# Patient Record
Sex: Female | Born: 1965
Health system: Southern US, Community
[De-identification: ages and names within clinical notes are randomized; demographics above are authoritative.]

## PROBLEM LIST (undated history)

## (undated) DIAGNOSIS — F419 Anxiety disorder, unspecified: Secondary | ICD-10-CM

## (undated) DIAGNOSIS — G4700 Insomnia, unspecified: Secondary | ICD-10-CM

## (undated) DIAGNOSIS — N951 Menopausal and female climacteric states: Secondary | ICD-10-CM

## (undated) DIAGNOSIS — B351 Tinea unguium: Secondary | ICD-10-CM

## (undated) DIAGNOSIS — M436 Torticollis: Secondary | ICD-10-CM

## (undated) HISTORY — DX: Insomnia, unspecified: G47.00

## (undated) HISTORY — DX: Tinea unguium: B35.1

## (undated) HISTORY — DX: Menopausal and female climacteric states: N95.1

## (undated) HISTORY — DX: Torticollis: M43.6

## (undated) HISTORY — DX: Anxiety disorder, unspecified: F41.9

---

## 2000-02-11 ENCOUNTER — Other Ambulatory Visit: Admission: RE | Admit: 2000-02-11 | Discharge: 2000-02-11 | Payer: Self-pay | Admitting: *Deleted

## 2002-06-11 ENCOUNTER — Other Ambulatory Visit: Admission: RE | Admit: 2002-06-11 | Discharge: 2002-06-11 | Payer: Self-pay | Admitting: Obstetrics and Gynecology

## 2012-11-06 ENCOUNTER — Other Ambulatory Visit: Payer: Self-pay | Admitting: Family Medicine

## 2012-11-06 DIAGNOSIS — Z1231 Encounter for screening mammogram for malignant neoplasm of breast: Secondary | ICD-10-CM

## 2012-11-06 DIAGNOSIS — Z803 Family history of malignant neoplasm of breast: Secondary | ICD-10-CM

## 2012-11-07 ENCOUNTER — Ambulatory Visit: Payer: Self-pay

## 2012-12-05 ENCOUNTER — Ambulatory Visit: Payer: Self-pay

## 2013-12-28 DIAGNOSIS — F419 Anxiety disorder, unspecified: Secondary | ICD-10-CM | POA: Insufficient documentation

## 2013-12-30 DIAGNOSIS — R030 Elevated blood-pressure reading, without diagnosis of hypertension: Secondary | ICD-10-CM

## 2013-12-30 DIAGNOSIS — IMO0001 Reserved for inherently not codable concepts without codable children: Secondary | ICD-10-CM | POA: Insufficient documentation

## 2015-06-01 DIAGNOSIS — M436 Torticollis: Secondary | ICD-10-CM

## 2015-06-01 HISTORY — DX: Torticollis: M43.6

## 2015-07-29 ENCOUNTER — Telehealth: Payer: Self-pay | Admitting: Family Medicine

## 2015-07-29 ENCOUNTER — Ambulatory Visit (INDEPENDENT_AMBULATORY_CARE_PROVIDER_SITE_OTHER): Payer: No Typology Code available for payment source

## 2015-07-29 ENCOUNTER — Encounter: Payer: Self-pay | Admitting: Family Medicine

## 2015-07-29 ENCOUNTER — Ambulatory Visit (INDEPENDENT_AMBULATORY_CARE_PROVIDER_SITE_OTHER): Payer: No Typology Code available for payment source | Admitting: Family Medicine

## 2015-07-29 VITALS — BP 133/78 | HR 74 | Temp 98.4°F | Resp 20 | Ht 65.5 in | Wt 167.0 lb

## 2015-07-29 DIAGNOSIS — G8929 Other chronic pain: Secondary | ICD-10-CM | POA: Insufficient documentation

## 2015-07-29 DIAGNOSIS — M25561 Pain in right knee: Secondary | ICD-10-CM

## 2015-07-29 MED ORDER — TRAMADOL HCL 50 MG PO TABS: 50.0000 mg | ORAL_TABLET | Freq: Three times a day (TID) | ORAL | Status: DC | PRN

## 2015-07-29 MED ORDER — NAPROXEN 500 MG PO TABS: 500.0000 mg | ORAL_TABLET | Freq: Two times a day (BID) | ORAL | Status: DC

## 2015-07-29 NOTE — Patient Instructions (Addendum)
It was a pleasure meeting you today.  - please make an appt at your earliest convenience for annual exam. Your yearly physical, screening and immunizations will be addressed at that time. Your last annual by prior records was 01/2014.   Knee Effusion Knee effusion means that you have excess fluid in your knee joint. This can cause pain and swelling in your knee. This may make your knee more difficult to bend and move. That is because there is increased pain and pressure in the joint. If there is fluid in your knee, it often means that something is wrong inside your knee, such as severe arthritis, abnormal inflammation, or an infection. Another common cause of knee effusion is an injury to the knee muscles, ligaments, or cartilage. HOME CARE INSTRUCTIONS  Use crutches as directed by your health care provider.  Wear a knee brace as directed by your health care provider.  Apply ice to the swollen area:  Put ice in a plastic bag.  Place a towel between your skin and the bag.  Leave the ice on for 20 minutes, 2-3 times per day.  Keep your knee raised (elevated) when you are sitting or lying down.  Take medicines only as directed by your health care provider.  Do any rehabilitation or strengthening exercises as directed by your health care provider.  Rest your knee as directed by your health care provider. You may start doing your normal activities again when your health care provider approves.   Keep all follow-up visits as directed by your health care provider. This is important. SEEK MEDICAL CARE IF:  You have ongoing (persistent) pain in your knee. SEEK IMMEDIATE MEDICAL CARE IF:  You have increased swelling or redness of your knee.  You have severe pain in your knee.  You have a fever.   This information is not intended to replace advice given to you by your health care provider. Make sure you discuss any questions you have with your health care provider.   Document  Released: 08/07/2003 Document Revised: 06/07/2014 Document Reviewed: 12/31/2013 Elsevier Interactive Patient Education [employer]

## 2015-07-29 NOTE — Progress Notes (Signed)
Patient ID: Nancy Berry, female   DOB: September 27, 1965, 50 y.o.   MRN: [id number]      Patient ID: Nancy Berry, female  DOB: 1965-07-28, 50 y.o.   MRN: [id number]  Subjective:  Nancy Berry is a 50 y.o. female present for knee pain and establishment of care.  All past medical history, surgical history, allergies, family history, immunizations, medications and social history were obtained and entered in the electronic medical record today. All recent labs, ED visits and hospitalizations within the last year were reviewed.  Right Knee pain: Right knee pain after re-starting running 2 weeks ago. Sleeping  And kneeling increase her pain. She has used Advil and husbands tramadol. Tramadol helped her sleep, but advil no relief.  She endorses stiffness in the morning and swelling. Swelling has gone down some. No erythema, or fever. Pt denies history of injury or present injury. She denies h/o arthritis.  She has had this prior, after exercise, and it went away without intervention. Patient has iced the area, walking instead of running, wears compression on occassions and feels benefit.   Past Medical History  Diagnosis Date  . Anxiety   . Elevated BP   . Insomnia   . Perimenopause   . Onychomycosis    No Known Allergies Past Surgical History  Procedure Laterality Date  . Cesarean section     No family history on file. Social History   Social History  . Marital Status: Married    Spouse Name: N/A  . Number of Children: N/A  . Years of Education: N/A   Occupational History  . Not on file.   Social History Main Topics  . Smoking status: Not on file  . Smokeless tobacco: Not on file  . Alcohol Use: Not on file  . Drug Use: Not on file  . Sexual Activity: Not on file   Other Topics Concern  . Not on file   Social History Narrative  . No narrative on file    ROS: Negative, with the exception of above mentioned in HPI  Objective: There were no vitals taken for this  visit. Gen: Afebrile. No acute distress. Nontoxic in appearance, well-developed, well-nourished, female HENT: AT. [place]. MMM, no oral lesions. Good dentition Eyes:Pupils Equal Round Reactive to light, Extraocular movements intact,  Conjunctiva without redness, discharge or icterus. Neck/lymp/endocrine: Supple, No lymphadenopathy CV: RRR   Chest: CTAB, no wheeze, rhonchi or crackles.   MSK (right knee): No erythema. effusion and soft tissue swelling present. No TTP joint lines, or bony prominences. Decreased flexion, otherwise full extension. Mild crepitus bilateral knees on exam. No ligament laxity, negative Lachman.  Neurovascularly intact distally.  Skin: No  rashes, purpura or petechiae. Warm and well-perfused. Skin intact. Neuro/Msk: Normal gait. PERLA. EOMi. Alert. Oriented x3.  Psych: Normal affect, dress and demeanor. Normal speech. Normal thought content and judgment.   Assessment/plan: Nancy Berry is a 50 y.o. female present for establishment of care with knee pain.  1. Right knee pain - Compression sleeve use - tramadol moderate to severe for pain only - naproxen (NAPROSYN) 500 MG tablet; Take 1 tablet (500 mg total) by mouth 2 (two) times daily with a meal.  Dispense: 60 tablet; Refill: 0 - DG Knee Complete 4 Views Right; Future - traMADol (ULTRAM) 50 MG tablet; Take 1 tablet (50 mg total) by mouth every 8 (eight) hours as needed.  Dispense: 30 tablet; Refill: 0 - Heat/ice instructions given - F/U in 1 week if not  improving, would consider steroid injection vs sports med referral.  - Pt encouraged to schedule annual exam, last annual 01/2014 by record review.   Electronically signed by: Felix Pacini, DO [facility]

## 2015-07-29 NOTE — Telephone Encounter (Signed)
Please call pt: - her xray result is normal.

## 2015-07-30 NOTE — Telephone Encounter (Signed)
Spoke with patient reviewed xray results. 

## 2015-08-19 ENCOUNTER — Encounter: Payer: Self-pay | Admitting: Gastroenterology

## 2015-08-19 ENCOUNTER — Ambulatory Visit (INDEPENDENT_AMBULATORY_CARE_PROVIDER_SITE_OTHER): Payer: No Typology Code available for payment source | Admitting: Family Medicine

## 2015-08-19 ENCOUNTER — Encounter: Payer: Self-pay | Admitting: Family Medicine

## 2015-08-19 VITALS — BP 111/80 | HR 76 | Temp 97.8°F | Resp 20 | Ht 65.0 in | Wt 164.2 lb

## 2015-08-19 DIAGNOSIS — R635 Abnormal weight gain: Secondary | ICD-10-CM

## 2015-08-19 DIAGNOSIS — Z0000 Encounter for general adult medical examination without abnormal findings: Secondary | ICD-10-CM

## 2015-08-19 DIAGNOSIS — Z13220 Encounter for screening for lipoid disorders: Secondary | ICD-10-CM

## 2015-08-19 DIAGNOSIS — M25561 Pain in right knee: Secondary | ICD-10-CM | POA: Diagnosis not present

## 2015-08-19 DIAGNOSIS — R5383 Other fatigue: Secondary | ICD-10-CM

## 2015-08-19 DIAGNOSIS — Z1211 Encounter for screening for malignant neoplasm of colon: Secondary | ICD-10-CM

## 2015-08-19 DIAGNOSIS — M791 Myalgia, unspecified site: Secondary | ICD-10-CM | POA: Insufficient documentation

## 2015-08-19 DIAGNOSIS — G8929 Other chronic pain: Secondary | ICD-10-CM | POA: Insufficient documentation

## 2015-08-19 DIAGNOSIS — Z114 Encounter for screening for human immunodeficiency virus [HIV]: Secondary | ICD-10-CM

## 2015-08-19 DIAGNOSIS — Z1159 Encounter for screening for other viral diseases: Secondary | ICD-10-CM

## 2015-08-19 LAB — COMPREHENSIVE METABOLIC PANEL
ALBUMIN: 4 g/dL (ref 3.6–5.1)
ALK PHOS: 43 U/L (ref 33–130)
ALT: 24 U/L (ref 6–29)
AST: 20 U/L (ref 10–35)
BILIRUBIN TOTAL: 0.5 mg/dL (ref 0.2–1.2)
BUN: 18 mg/dL (ref 7–25)
CHLORIDE: 107 mmol/L (ref 98–110)
CO2: 20 mmol/L (ref 20–31)
CREATININE: 0.91 mg/dL (ref 0.50–1.05)
Calcium: 8.9 mg/dL (ref 8.6–10.4)
Glucose, Bld: 85 mg/dL (ref 65–99)
Potassium: 4.4 mmol/L (ref 3.5–5.3)
SODIUM: 140 mmol/L (ref 135–146)
TOTAL PROTEIN: 7.1 g/dL (ref 6.1–8.1)

## 2015-08-19 LAB — CBC WITH DIFFERENTIAL/PLATELET
BASOS ABS: 0.1 10*3/uL (ref 0.0–0.1)
BASOS PCT: 1 % (ref 0–1)
Eosinophils Absolute: 0.5 10*3/uL (ref 0.0–0.7)
Eosinophils Relative: 8 % — ABNORMAL HIGH (ref 0–5)
HCT: 45.9 % (ref 36.0–46.0)
HEMOGLOBIN: 15.3 g/dL — AB (ref 12.0–15.0)
LYMPHS ABS: 1.6 10*3/uL (ref 0.7–4.0)
Lymphocytes Relative: 24 % (ref 12–46)
MCH: 31 pg (ref 26.0–34.0)
MCHC: 33.3 g/dL (ref 30.0–36.0)
MCV: 93.1 fL (ref 78.0–100.0)
MONOS PCT: 8 % (ref 3–12)
MPV: 9.8 fL (ref 8.6–12.4)
Monocytes Absolute: 0.5 10*3/uL (ref 0.1–1.0)
NEUTROS ABS: 3.8 10*3/uL (ref 1.7–7.7)
Neutrophils Relative %: 59 % (ref 43–77)
Platelets: 306 10*3/uL (ref 150–400)
RBC: 4.93 MIL/uL (ref 3.87–5.11)
RDW: 13.9 % (ref 11.5–15.5)
WBC: 6.5 10*3/uL (ref 4.0–10.5)

## 2015-08-19 LAB — LIPID PANEL
CHOLESTEROL: 201 mg/dL — AB (ref 125–200)
HDL: 53 mg/dL (ref >=46)
LDL Cholesterol: 134 mg/dL — ABNORMAL HIGH (ref <130)
TRIGLYCERIDES: 72 mg/dL (ref <150)
Total CHOL/HDL Ratio: 3.8 Ratio (ref <=5.0)
VLDL: 14 mg/dL (ref <30)

## 2015-08-19 LAB — VITAMIN B12: VITAMIN B 12: 399 pg/mL (ref 200–1100)

## 2015-08-19 LAB — HIV ANTIBODY (ROUTINE TESTING W REFLEX): HIV: NONREACTIVE

## 2015-08-19 LAB — TSH: TSH: 1.35 mIU/L

## 2015-08-19 MED ORDER — TRAMADOL HCL 50 MG PO TABS: 50.0000 mg | ORAL_TABLET | Freq: Three times a day (TID) | ORAL | Status: DC | PRN

## 2015-08-19 NOTE — Progress Notes (Signed)
Patient ID: Nancy Berry, female   DOB: [birth year], 50 y.o.   MRN: [id number]      Patient ID: Nancy Berry, female  DOB: 04/18/1966, 50 y.o.   MRN: [id number]  Subjective:  Nancy Berry is a 50 y.o. female present for annual visit.  All past medical history, surgical history, allergies, family history, immunizations, medications and social history were updated in the electronic medical record today. All recent labs, ED visits and hospitalizations within the last year were reviewed.  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 But she feels she can use to gain weight of a total of 15 pounds. She feels this is adding stress to her knees, and this is the cause of her knee discomfort. She endorses increased fatigue, achy joints and muscles.   Right knee pain: Patient's right knee pain is much improved. She states it still mildly swollen. All x-rays were normal. Patient feels it is her weight is causing her to put more stress on her knees. She is asking for refill on tramadol, and the event it swells again. She feels that when she does attempt to start exercising again, and again flares. She does not desire a steroid injection at this time.  Health maintenance:  Colonoscopy: MGM colon cancer >60, No screening completed yet. Mammogram: UTD last mammogram 2015 ([facility]) prior normal, FHX breast cancer in mother, patient has mammogram scheduled Cervical cancer screening: UTD last PAP 2015, unknown co-testing. Patient has gynecology appointment scheduled. Immunizations: tdap UTD, declined flu Infectious disease screening: HIV and Hep C indicated DEXA: screen 65 Assistive device: None  Oxygen use: None Patient has a Dental home. Hospitalizations/ED visits: None   Depression screen [facility] 2/9 08/19/2015  Decreased Interest 0  Down, Depressed, Hopeless 0  PHQ - 2 Score 0   Fall Risk  08/19/2015  Falls in the past year? No   Current Exercise Habits:  Structured exercise class, Type of exercise: calisthenics;treadmill, Time (Minutes): 30, Frequency (Times/Week): 3, Weekly Exercise (Minutes/Week): 90, Intensity: Moderate    Past Medical History  Diagnosis Date  . Anxiety   . Elevated BP   . Insomnia   . Perimenopause   . Onychomycosis    No Known Allergies Past Surgical History  Procedure Laterality Date  . Cesarean section     Family History  Problem Relation Age of Onset  . Breast cancer Mother 50  . Colon cancer Maternal Grandmother 75   Social History   Social History  . Marital Status: Married    Spouse Name: N/A  . Number of Children: N/A  . Years of Education: N/A   Occupational History  . Not on file.   Social History Main Topics  . Smoking status: Never Smoker   . Smokeless tobacco: Never Used  . Alcohol Use: No  . Drug Use: No  . Sexual Activity: Yes     Comment: "withdrawal"   Other Topics Concern  . Not on file   Social History Narrative   Married, husband Ron. 2 children [place], [place])    AAS degree, Loss analyst    Drinks caffeine, uses herbal remedies.    Wears seatbelt, exercises >3x a week, smoke detector in the home   Firearms  In the home (locked cabinets)   Feels safe in her relationships.         ROS: Negative, with the exception of above mentioned in HPI  Objective: BP 111/80 mmHg  Pulse 76  Temp(Src) 97.8 F (36.6 C)  Resp 20  Ht '5\' 5"'$  (1.651 m)  Wt 164 lb 4 oz (74.503 kg)  BMI 27.33 kg/m2  SpO2 96%  LMP 07/02/2015 Gen: Afebrile. No acute distress. Nontoxic in appearance, well-developed, well-nourished, female, Pleasant Caucasian female   HENT: AT. [place]. Bilateral TM visualized and normal in appearance, normal external auditory canal. MMM, no oral lesions, good dentition. Bilateral nares without erythema or swelling. Throat without erythema, ulcerations or exudates. No Cough on exam, no hoarseness on exam. Eyes:Pupils Equal Round Reactive to light, Extraocular movements  intact,  Conjunctiva without redness, discharge or icterus. Neck/lymp/endocrine: Supple, no lymphadenopathy, no thyromegaly CV: RRR no murmur appreciated, no edema, +2/4 P posterior tibialis pulses. No carotid bruits. No JVD. Chest: CTAB, no wheeze, rhonchi or crackles. Normal Respiratory effort. Good Air movement. Abd: Soft. Flat. NTND. BS present. No Masses palpated. No hepatosplenomegaly. No rebound tenderness or guarding. Skin: No rashes, purpura or petechiae. Warm and well-perfused. Skin intact. Neuro/Msk:  Normal gait. PERLA. EOMi. Alert. Oriented x3.  Cranial nerves II through XII intact. Consistent small knee effusion, right. Muscle strength 5/5 upper and lower extremity. DTRs equal bilaterally. Psych: Normal affect, dress and demeanor. Normal speech. Normal thought content and judgment.   Assessment/plan: Nancy Berry is a 50 y.o. female present for annual exam.  1. Right knee pain - right knee still with mild effusion/swelling today. Pt states the pain is much better. Requested additional tramadol for acute pain only today. I am agreeable to refill as long as not requiring more than 3 scripts a year. - traMADol (ULTRAM) 50 MG tablet; Take 1 tablet (50 mg total) by mouth every 8 (eight) hours as needed.  Dispense: 30 tablet; Refill: 0  2. Other fatigue/muscle aches/weight gain - Possible secondary to weight gain. Will check labs today and consider further workup dependent on results.  - consider ESR/CRP etc if labs today abnormal - TSH - VITAMIN D 25 Hydroxy (Vit-D Deficiency, Fractures) - B12 - CBC w/Diff - Comp Met (CMET)  3. Need for hepatitis C screening test - Hepatitis C Antibody  4. Encounter for screening for HIV - HIV antibody (with reflex)  5. Weight gain - TSH - encouraged routine exercise, dietary modifications.   6. Screening cholesterol level - Lipid panel  7. Colon cancer screening - Ambulatory referral to Gastroenterology  8. Preventive healthcare  encounter: Gender specific AVS was provided to patient today. Patient was encouraged to supplement with over-the-counter 600-800 units of vitamin D daily. Patient was encouraged to exercise greater than 150 minutes a week.   Return in about 1 year (around 08/18/2016) for CPE.  Electronically signed by: Howard Pouch, DO [place]

## 2015-08-19 NOTE — Patient Instructions (Signed)
Health Maintenance, Female Adopting a healthy lifestyle and getting preventive care can go a long way to promote health and wellness. Talk with your health care provider about what schedule of regular examinations is right for you. This is a good chance for you to check in with your provider about disease prevention and staying healthy. In between checkups, there are plenty of things you can do on your own. Experts have done a lot of research about which lifestyle changes and preventive measures are most likely to keep you healthy. Ask your health care provider for more information. WEIGHT AND DIET  Eat a healthy diet  Be sure to include plenty of vegetables, fruits, low-fat dairy products, and lean protein.  Do not eat a lot of foods high in solid fats, added sugars, or salt.  Get regular exercise. This is one of the most important things you can do for your health.  Most adults should exercise for at least 150 minutes each week. The exercise should increase your heart rate and make you sweat (moderate-intensity exercise).  Most adults should also do strengthening exercises at least twice a week. This is in addition to the moderate-intensity exercise.  Maintain a healthy weight  Body mass index (BMI) is a measurement that can be used to identify possible weight problems. It estimates body fat based on height and weight. Your health care provider can help determine your BMI and help you achieve or maintain a healthy weight.  For females 20 years of age and older:   A BMI below 18.5 is considered underweight.  A BMI of 18.5 to 24.9 is normal.  A BMI of 25 to 29.9 is considered overweight.  A BMI of 30 and above is considered obese.  Watch levels of cholesterol and blood lipids  You should start having your blood tested for lipids and cholesterol at 52 years of age, then have this test every 5 years.  You may need to have your cholesterol levels checked more often if:  Your lipid  or cholesterol levels are high.  You are older than 52 years of age.  You are at high risk for heart disease.  CANCER SCREENING   Lung Cancer  Lung cancer screening is recommended for adults 55-80 years old who are at high risk for lung cancer because of a history of smoking.  A yearly low-dose CT scan of the lungs is recommended for people who:  Currently smoke.  Have quit within the past 15 years.  Have at least a 30-pack-year history of smoking. A pack year is smoking an average of one pack of cigarettes a day for 1 year.  Yearly screening should continue until it has been 15 years since you quit.  Yearly screening should stop if you develop a health problem that would prevent you from having lung cancer treatment.  Breast Cancer  Practice breast self-awareness. This means understanding how your breasts normally appear and feel.  It also means doing regular breast self-exams. Let your health care provider know about any changes, no matter how small.  If you are in your 20s or 30s, you should have a clinical breast exam (CBE) by a health care provider every 1-3 years as part of a regular health exam.  If you are 40 or older, have a CBE every year. Also consider having a breast X-ray (mammogram) every year.  If you have a family history of breast cancer, talk to your health care provider about genetic screening.  If you   are at high risk for breast cancer, talk to your health care provider about having an MRI and a mammogram every year.  Breast cancer gene (BRCA) assessment is recommended for women who have family members with BRCA-related cancers. BRCA-related cancers include:  Breast.  Ovarian.  Tubal.  Peritoneal cancers.  Results of the assessment will determine the need for genetic counseling and BRCA1 and BRCA2 testing. Cervical Cancer Your health care provider may recommend that you be screened regularly for cancer of the pelvic organs (ovaries, uterus, and  vagina). This screening involves a pelvic examination, including checking for microscopic changes to the surface of your cervix (Pap test). You may be encouraged to have this screening done every 3 years, beginning at age 21.  For women ages 30-65, health care providers may recommend pelvic exams and Pap testing every 3 years, or they may recommend the Pap and pelvic exam, combined with testing for human papilloma virus (HPV), every 5 years. Some types of HPV increase your risk of cervical cancer. Testing for HPV may also be done on women of any age with unclear Pap test results.  Other health care providers may not recommend any screening for nonpregnant women who are considered low risk for pelvic cancer and who do not have symptoms. Ask your health care provider if a screening pelvic exam is right for you.  If you have had past treatment for cervical cancer or a condition that could lead to cancer, you need Pap tests and screening for cancer for at least 20 years after your treatment. If Pap tests have been discontinued, your risk factors (such as having a new sexual partner) need to be reassessed to determine if screening should resume. Some women have medical problems that increase the chance of getting cervical cancer. In these cases, your health care provider may recommend more frequent screening and Pap tests. Colorectal Cancer  This type of cancer can be detected and often prevented.  Routine colorectal cancer screening usually begins at 52 years of age and continues through 52 years of age.  Your health care provider may recommend screening at an earlier age if you have risk factors for colon cancer.  Your health care provider may also recommend using home test kits to check for hidden blood in the stool.  A small camera at the end of a tube can be used to examine your colon directly (sigmoidoscopy or colonoscopy). This is done to check for the earliest forms of colorectal  cancer.  Routine screening usually begins at age 50.  Direct examination of the colon should be repeated every 5-10 years through 52 years of age. However, you may need to be screened more often if early forms of precancerous polyps or small growths are found. Skin Cancer  Check your skin from head to toe regularly.  Tell your health care provider about any new moles or changes in moles, especially if there is a change in a mole's shape or color.  Also tell your health care provider if you have a mole that is larger than the size of a pencil eraser.  Always use sunscreen. Apply sunscreen liberally and repeatedly throughout the day.  Protect yourself by wearing long sleeves, pants, a wide-brimmed hat, and sunglasses whenever you are outside. HEART DISEASE, DIABETES, AND HIGH BLOOD PRESSURE   High blood pressure causes heart disease and increases the risk of stroke. High blood pressure is more likely to develop in:  People who have blood pressure in the high end   of the normal range (130-139/85-89 mm Hg).  People who are overweight or obese.  People who are African American.  If you are 38-23 years of age, have your blood pressure checked every 3-5 years. If you are 61 years of age or older, have your blood pressure checked every year. You should have your blood pressure measured twice--once when you are at a hospital or clinic, and once when you are not at a hospital or clinic. Record the average of the two measurements. To check your blood pressure when you are not at a hospital or clinic, you can use:  An automated blood pressure machine at a pharmacy.  A home blood pressure monitor.  If you are between 45 years and 39 years old, ask your health care provider if you should take aspirin to prevent strokes.  Have regular diabetes screenings. This involves taking a blood sample to check your fasting blood sugar level.  If you are at a normal weight and have a low risk for diabetes,  have this test once every three years after 52 years of age.  If you are overweight and have a high risk for diabetes, consider being tested at a younger age or more often. PREVENTING INFECTION  Hepatitis B  If you have a higher risk for hepatitis B, you should be screened for this virus. You are considered at high risk for hepatitis B if:  You were born in a country where hepatitis B is common. Ask your health care provider which countries are considered high risk.  Your parents were born in a high-risk country, and you have not been immunized against hepatitis B (hepatitis B vaccine).  You have HIV or AIDS.  You use needles to inject street drugs.  You live with someone who has hepatitis B.  You have had sex with someone who has hepatitis B.  You get hemodialysis treatment.  You take certain medicines for conditions, including cancer, organ transplantation, and autoimmune conditions. Hepatitis C  Blood testing is recommended for:  Everyone born from [birth year] through 1967.  Anyone with known risk factors for hepatitis C. Sexually transmitted infections (STIs)  You should be screened for sexually transmitted infections (STIs) including gonorrhea and chlamydia if:  You are sexually active and are younger than 52 years of age.  You are older than 53 years of age and your health care provider tells you that you are at risk for this type of infection.  Your sexual activity has changed since you were last screened and you are at an increased risk for chlamydia or gonorrhea. Ask your health care provider if you are at risk.  If you do not have HIV, but are at risk, it may be recommended that you take a prescription medicine daily to prevent HIV infection. This is called pre-exposure prophylaxis (PrEP). You are considered at risk if:  You are sexually active and do not regularly use condoms or know the HIV status of your partner(s).  You take drugs by injection.  You are sexually  active with a partner who has HIV. Talk with your health care provider about whether you are at high risk of being infected with HIV. If you choose to begin PrEP, you should first be tested for HIV. You should then be tested every 3 months for as long as you are taking PrEP.  PREGNANCY   If you are premenopausal and you may become pregnant, ask your health care provider about preconception counseling.  If you may  become pregnant, take 400 to 800 micrograms (mcg) of folic acid every day.  If you want to prevent pregnancy, talk to your health care provider about birth control (contraception). OSTEOPOROSIS AND MENOPAUSE   Osteoporosis is a disease in which the bones lose minerals and strength with aging. This can result in serious bone fractures. Your risk for osteoporosis can be identified using a bone density scan.  If you are 61 years of age or older, or if you are at risk for osteoporosis and fractures, ask your health care provider if you should be screened.  Ask your health care provider whether you should take a calcium or vitamin D supplement to lower your risk for osteoporosis.  Menopause may have certain physical symptoms and risks.  Hormone replacement therapy may reduce some of these symptoms and risks. Talk to your health care provider about whether hormone replacement therapy is right for you.  HOME CARE INSTRUCTIONS   Schedule regular health, dental, and eye exams.  Stay current with your immunizations.   Do not use any tobacco products including cigarettes, chewing tobacco, or electronic cigarettes.  If you are pregnant, do not drink alcohol.  If you are breastfeeding, limit how much and how often you drink alcohol.  Limit alcohol intake to no more than 1 drink per day for nonpregnant women. One drink equals 12 ounces of beer, 5 ounces of wine, or 1 ounces of hard liquor.  Do not use street drugs.  Do not share needles.  Ask your health care provider for help if  you need support or information about quitting drugs.  Tell your health care provider if you often feel depressed.  Tell your health care provider if you have ever been abused or do not feel safe at home.   This information is not intended to replace advice given to you by your health care provider. Make sure you discuss any questions you have with your health care provider.   Document Released: 11/30/2010 Document Revised: 06/07/2014 Document Reviewed: 04/18/2013 Elsevier Interactive Patient Education [employer].

## 2015-08-20 ENCOUNTER — Telehealth: Payer: Self-pay | Admitting: Family Medicine

## 2015-08-20 LAB — HEPATITIS C ANTIBODY: HCV AB: NEGATIVE

## 2015-08-20 LAB — VITAMIN D 25 HYDROXY (VIT D DEFICIENCY, FRACTURES): VIT D 25 HYDROXY: 27 ng/mL — AB (ref 30–100)

## 2015-08-20 MED ORDER — VITAMIN D (ERGOCALCIFEROL) 1.25 MG (50000 UNIT) PO CAPS: 50000.0000 [IU] | ORAL_CAPSULE | ORAL | Status: DC

## 2015-08-20 NOTE — Telephone Encounter (Signed)
Please call pt: - Her Vit D is mildly insufficient with a level of 27, normal is 30, however prefer D to be in the 40s. I have called in a 12 week once weekly Vit D script. She is to start  800 u of vitamin D OTC, after 12 week supplement complete. - B12 is normal, but lower end normal, could benefit from daily b12 (usually 100mg ). - Thyroid, and all other labs are normal - HIV and Hep C negative - would want to followup with her in 12 weeks for an appt to follow up vit d, and present symptoms, if no resolution we can proceed with further work up if she desires, sooner if worsening symptoms.

## 2015-08-20 NOTE — Telephone Encounter (Signed)
Spoke with patient reviewed lab results and instructions. Patient verbalized understanding . Vit D Rx sent to pharmacy.

## 2015-08-27 ENCOUNTER — Telehealth: Payer: Self-pay | Admitting: Family Medicine

## 2015-08-27 DIAGNOSIS — F419 Anxiety disorder, unspecified: Secondary | ICD-10-CM

## 2015-08-27 MED ORDER — LORAZEPAM 0.5 MG PO TABS: 0.5000 mg | ORAL_TABLET | Freq: Two times a day (BID) | ORAL | Status: DC | PRN

## 2015-08-27 NOTE — Telephone Encounter (Signed)
Patient is requesting medication for her nerves. She said she is taking Tramodol which helps but feels like it doesn't calm her nerves completely. Please contact patient

## 2015-08-27 NOTE — Telephone Encounter (Signed)
Please call pt: - I received her request after 5 pm for "something for her nerves". Patient has not been evaluated for anxiety, by this provider. She does have a past medical history of anxiety with ativan 0.5 mg TID PRN use. I am willing to prescribe 1 month with no refills of the ativan at prior dose, but she will need to follow up to discuss prior to refill to make sure her anxiety is controlled and have documentation of problem.

## 2015-08-28 ENCOUNTER — Encounter: Payer: Self-pay | Admitting: Family Medicine

## 2015-08-28 DIAGNOSIS — R928 Other abnormal and inconclusive findings on diagnostic imaging of breast: Secondary | ICD-10-CM | POA: Insufficient documentation

## 2015-08-28 NOTE — Telephone Encounter (Signed)
Rx called to pharmacy

## 2015-10-09 ENCOUNTER — Encounter: Payer: No Typology Code available for payment source | Admitting: Gastroenterology

## 2015-12-05 ENCOUNTER — Ambulatory Visit (INDEPENDENT_AMBULATORY_CARE_PROVIDER_SITE_OTHER): Payer: No Typology Code available for payment source | Admitting: Family Medicine

## 2015-12-05 ENCOUNTER — Encounter: Payer: Self-pay | Admitting: Family Medicine

## 2015-12-05 ENCOUNTER — Telehealth: Payer: Self-pay | Admitting: Family Medicine

## 2015-12-05 VITALS — BP 128/78 | HR 76 | Temp 98.0°F | Resp 20 | Ht 65.0 in | Wt 164.8 lb

## 2015-12-05 DIAGNOSIS — F419 Anxiety disorder, unspecified: Secondary | ICD-10-CM | POA: Diagnosis not present

## 2015-12-05 DIAGNOSIS — E559 Vitamin D deficiency, unspecified: Secondary | ICD-10-CM

## 2015-12-05 LAB — VITAMIN D 25 HYDROXY (VIT D DEFICIENCY, FRACTURES): VITD: 44.77 ng/mL (ref 30.00–100.00)

## 2015-12-05 MED ORDER — LORAZEPAM 0.5 MG PO TABS: 0.5000 mg | ORAL_TABLET | Freq: Two times a day (BID) | ORAL | Status: DC | PRN

## 2015-12-05 NOTE — Telephone Encounter (Signed)
Please call pt: - her labs are normal.  - Please advise her to take 800 u day, 1000 u of calcium daily  Results for orders placed or performed in visit on 12/05/15 (from the past 24 hour(s))  VITAMIN D 25 Hydroxy (Vit-D Deficiency, Fractures)     Status: None   Collection Time: 12/05/15  2:00 PM  Result Value Ref Range   VITD 44.77 30.00 - 100.00 ng/mL

## 2015-12-05 NOTE — Progress Notes (Signed)
Patient ID: Nancy BealSherri L Godinho, female   DOB: 03-25-1966, 50 y.o.   MRN: [id number]      Patient ID: Nancy BealSherri L Schamp, female  DOB: 03-25-1966, 50 y.o.   MRN: [id number]  Subjective:  Nancy Berry is a 50 y.o. female present for follow up on fatigue and vit d insuffiencey. She has been taking the the Vitamin D 7829550000 u weekly.   Anxiety situational: patient uses ativan 0.5 mg as needed. She is currently having problems with her mother, who is now in the end stages of dementia. [facility] and is not expected to live much longer.   fatigue: Patient states she does feel mild improvement in her fatigue with the increase in vit d. She does not sleep well secondary to staying with her mother, who is in a dementia facility.   Right knee pain: Patient's right knee pain improved, but still has occasional flares in which she takes tramadol. She reports the knee worsens with bending, squatting, walking long distances. She reports it occurs infrequently and does not feel she needs referral at this time.    Depression screen PHQ 2/9 08/19/2015  Decreased Interest 0  Down, Depressed, Hopeless 0  PHQ - 2 Score 0   Fall Risk  08/19/2015  Falls in the past year? No        Past Medical History  Diagnosis Date  . Anxiety   . Elevated BP   . Insomnia   . Perimenopause   . Onychomycosis    No Known Allergies Past Surgical History  Procedure Laterality Date  . Cesarean section     Family History  Problem Relation Age of Onset  . Breast cancer Mother 8975  . Colon cancer Maternal Grandmother 4075   Social History   Social History  . Marital Status: Married    Spouse Name: N/A  . Number of Children: N/A  . Years of Education: N/A   Occupational History  . Not on file.   Social History Main Topics  . Smoking status: Never Smoker   . Smokeless tobacco: Never Used  . Alcohol Use: No  . Drug Use: No  . Sexual Activity: Yes     Comment: "withdrawal"   Other Topics Concern    . Not on file   Social History Narrative   Married, husband Ron. 2 children Rosalyn Gess([place], [place])    AAS degree, Loss analyst    Drinks caffeine, uses herbal remedies.    Wears seatbelt, exercises >3x a week, smoke detector in the home   Firearms  In the home (locked cabinets)   Feels safe in her relationships.         ROS: Negative, with the exception of above mentioned in HPI  Objective: BP 128/78 mmHg  Pulse 76  Temp(Src) 98 F (36.7 C)  Resp 20  Ht 5\' 5"  (1.651 m)  Wt 164 lb 12 oz (74.73 kg)  BMI 27.42 kg/m2  SpO2 96% Gen: Afebrile. No acute distress. Nontoxic in appearance, well-developed, well-nourished, female, Pleasant Caucasian female   HENT: AT. [place]. . MMM, no oral lesions, Eyes:Pupils Equal Round Reactive to light, Extraocular movements intact,  Conjunctiva without redness, discharge or icterus. CV: RRR no murmur appreciated, no edema, +2/4 P posterior tibialis pulses.  Chest: CTAB, no wheeze, rhonchi or crackles.  Abd: Soft. Flat. NTND. BS present. Skin: Warm and well-perfused. Skin intact. Neuro/Msk:  Normal gait. PERLA. EOMi. Alert. Oriented x3.   Psych: Normal affect, dress and  demeanor. Normal speech. Normal thought content and judgment.   Assessment/plan: Nancy BealSherri L Widjaja is a 50 y.o. female present for follow visit on : 1. Right knee pain - right knee still with mild effusion/swelling today. Pt states the pain is much better. Requested additional tramadol for acute pain only today. I am agreeable to refill as long as not requiring more than 3 scripts a year. - traMADol (ULTRAM) 50 MG tablet; Take 1 tablet (50 mg total) by mouth every 8 (eight) hours as needed.  Dispense: 30 tablet; Refill: 0  2. Other fatigue/muscle aches/weight gain/vit d insufficiency: - improved. TSH, B12, cbc and CMP normal.  - Vit d insufficient and at end of 12 week supplement.  - recheck today and advise on ca/vit d supplement dose.   3. Anxiety: refills on ativan today.   > 25  minutes spent with patient, >50% of time spent face to face counseling patient and coordinating care.  Electronically signed by: Felix Pacinienee Vanesha [place], DO [facility] [place]

## 2015-12-05 NOTE — Patient Instructions (Signed)
We will fax in ativan for you.  We will call you with Vit D level and advise of dose.

## 2015-12-08 NOTE — Telephone Encounter (Signed)
Spoke with patient reviewed lab results and instructions. Patient verbalized understanding. 

## 2015-12-19 ENCOUNTER — Telehealth: Payer: Self-pay | Admitting: *Deleted

## 2015-12-19 DIAGNOSIS — M25561 Pain in right knee: Secondary | ICD-10-CM

## 2015-12-19 MED ORDER — TRAMADOL HCL 50 MG PO TABS: 50.0000 mg | ORAL_TABLET | Freq: Three times a day (TID) | ORAL | Status: DC | PRN

## 2015-12-19 NOTE — Telephone Encounter (Signed)
Pt LMOM on 12/19/15 at 8:11am requesting refill for Tramadol. She stated that at her last office visit she was told by Dr. Claiborne BillingsKuneff that if her knee started bothering her again she would give her another Rx for Tramadol.   RF request for Tramadol LOV: 12/05/15 Next ov: None Last written: 08/19/15 #30 w/ 0RF  Spoke verbally with Dr. Milinda CaveMcGowen and he approved refill. Rx printed and signed by Dr. Milinda CaveMcGowen.  Spoke to pt to find out where she would like Rx sent. She [place] but they may be coming back home. She stated that she will call back later to let us know where to send Rx.

## 2015-12-19 NOTE — Telephone Encounter (Signed)
Pt LMOM up front requesting that Rx be sent to [facility]. Rx called into pharmacy.

## 2016-04-12 ENCOUNTER — Telehealth: Payer: Self-pay | Admitting: *Deleted

## 2016-04-12 DIAGNOSIS — G8929 Other chronic pain: Secondary | ICD-10-CM

## 2016-04-12 DIAGNOSIS — M25561 Pain in right knee: Principal | ICD-10-CM

## 2016-04-12 NOTE — Telephone Encounter (Signed)
Patient called requesting refill on Tramadol for her knee pain. Last seen for this 07/29/15. Last refill 12/19/15 for 30 tabs. Patient states ibuprofen and tylenol do not work for her. Please advise.

## 2016-04-13 MED ORDER — TRAMADOL HCL 50 MG PO TABS: 50.0000 mg | ORAL_TABLET | Freq: Three times a day (TID) | ORAL | 0 refills | Status: DC | PRN

## 2016-04-13 NOTE — Telephone Encounter (Signed)
Received request for tramadol refill for intermittent chronic knee pain. Refill prescribed.

## 2016-04-27 ENCOUNTER — Ambulatory Visit (INDEPENDENT_AMBULATORY_CARE_PROVIDER_SITE_OTHER): Payer: No Typology Code available for payment source | Admitting: Family Medicine

## 2016-04-27 ENCOUNTER — Encounter: Payer: Self-pay | Admitting: Family Medicine

## 2016-04-27 VITALS — BP 109/74 | HR 73 | Temp 98.0°F | Resp 20 | Ht 65.0 in | Wt 169.5 lb

## 2016-04-27 DIAGNOSIS — M436 Torticollis: Secondary | ICD-10-CM | POA: Diagnosis not present

## 2016-04-27 DIAGNOSIS — M542 Cervicalgia: Secondary | ICD-10-CM | POA: Diagnosis not present

## 2016-04-27 MED ORDER — NAPROXEN 500 MG PO TABS: 500.0000 mg | ORAL_TABLET | Freq: Two times a day (BID) | ORAL | 0 refills | Status: DC

## 2016-04-27 MED ORDER — BACLOFEN 20 MG PO TABS: 20.0000 mg | ORAL_TABLET | Freq: Three times a day (TID) | ORAL | 0 refills | Status: DC

## 2016-04-27 MED ORDER — KETOROLAC TROMETHAMINE 60 MG/2ML IM SOLN
60.0000 mg | Freq: Once | INTRAMUSCULAR | Status: AC
  Administered 2016-04-27: 60 mg via INTRAMUSCULAR

## 2016-04-27 NOTE — Progress Notes (Signed)
Nancy BealSherri L Bise , 1966/04/09, 50 y.o., female MRN: [id number] Patient Care Team    Relationship Specialty Notifications Start End  Natalia Leatherwoodenee A Clent Damore, DO PCP - General Family Medicine  07/29/15   Lynden AngElizabeth Lane, NP Nurse Practitioner Obstetrics and Gynecology  08/19/15     CC: neck pain  Subjective: Pt presents for an acute OV with complaints of neck pain of 1 day duration.   Pt feels symptoms are progressively getting worse and it feels like a muscle cramp on the left side of her neck. She at first thought it was stress related or she slept on it wrong. She has never had this before. She is on the computer a great deal, but her position has unchanged. She denies trauma or injury.  Pt has tried tramadol to ease their symptoms. She states this has always taken away other pains, and it has not touched her neck pain. She rates her pain about 8/10 with movement and about 6/10 when she is still. She denies fever, chills, sore throat. She does not feel like she is able to move her neck. She is able to swallow, denies visual changes or tinnitus.   No Known Allergies Social History  Substance Use Topics  . Smoking status: Never Smoker  . Smokeless tobacco: Never Used  . Alcohol use No   Past Medical History:  Diagnosis Date  . Anxiety   . Elevated BP   . Insomnia   . Onychomycosis   . Perimenopause    Past Surgical History:  Procedure Laterality Date  . CESAREAN SECTION     Family History  Problem Relation Age of Onset  . Breast cancer Mother 6775  . Colon cancer Maternal Grandmother [id number]     Medication List       Accurate as of 04/27/16  8:55 AM. Always use your most recent med list.          LORazepam 0.5 MG tablet Commonly known as:  ATIVAN Take 1 tablet (0.5 mg total) by mouth 2 (two) times daily as needed for anxiety.   PROBIOTIC DAILY PO Take by mouth.   traMADol 50 MG tablet Commonly known as:  ULTRAM Take 1 tablet (50 mg total) by mouth every 8 (eight) hours as  needed.       No results found for this or any previous visit (from the past 24 hour(s)). No results found.   ROS: Negative, with the exception of above mentioned in HPI   Objective:  BP 109/74 (BP Location: Left Arm, Patient Position: Sitting, Cuff Size: Large)   Pulse 73   Temp 98 F (36.7 C)   Resp 20   Ht 5\' 5"  (1.651 m)   Wt 169 lb 8 oz (76.9 kg)   SpO2 98%   BMI 28.21 kg/m  Body mass index is 28.21 kg/m. Gen: Afebrile. No acute distress. Nontoxic in appearance, well developed, well nourished.  HENT: AT. [place]. MMM, no oral lesions.  Eyes:Pupils Equal Round Reactive to light, Extraocular movements intact,  Conjunctiva without redness, discharge or icterus. Neck/lymp/endocrine: Supple,no lymphadenopathy MSK: no erythema or bruising. Severe muscle spasm left upper and mid trap. Ropiness present. No bone tenderness. Decreased ROM fall planes. NV intact distally.  Skin: No  rashes, purpura or petechiae.  Neuro: Normal gait. PERLA. EOMi. Alert. Oriented x3 Cranial nerves II through XII intact.   Assessment/Plan: Nancy Berry is a 50 y.o. female present for acute OV for  Neck pain/Acute torticollis - heat therapy discussed,  light stretches.  - baclofen and naproxen schedule for 1 week.  - behavior modifications to not tilt head to left. Move monitors, TV, pillows  etc.  - baclofen (LIORESAL) 20 MG tablet; Take 1 tablet (20 mg total) by mouth 3 (three) times daily.  Dispense: 90 each; Refill: 0 - naproxen (NAPROSYN) 500 MG tablet; Take 1 tablet (500 mg total) by mouth 2 (two) times daily with a meal.  Dispense: 30 tablet; Refill: 0 - referral to Dr. Terrilee FilesZach Smith, DO for OMT.  - F/U 1-2 weeks or sooner if worsening or not already seeing Dr. Katrinka BlazingSmith    electronically signed by:  Felix Pacinienee Niasia Lanphear, DO  [facility] [place]

## 2016-04-27 NOTE — Patient Instructions (Addendum)
Acute Torticollis Torticollis is a condition in which the muscles of the neck tighten (contract) abnormally, causing the neck to twist and the head to move into an unnatural position. Torticollis that develops suddenly is called acute torticollis. If torticollis becomes chronic and is left untreated, the face and neck can become deformed. CAUSES This condition may be caused by:  Sleeping in an awkward position (common).  Extending or twisting the neck muscles beyond their normal position.  Infection. In some cases, the cause may not be known. SYMPTOMS Symptoms of this condition include:  An unnatural position of the head.  Neck pain.  A limited ability to move the neck.  Twisting of the neck to one side. DIAGNOSIS This condition is diagnosed with a physical exam. You may also have imaging tests, such as an X-ray, CT scan, or MRI. TREATMENT Treatment for this condition involves trying to relax the neck muscles. It may include:  Medicines or shots.  Physical therapy.  Surgery. This may be done in severe cases. HOME CARE INSTRUCTIONS  Take medicines only as directed by your health care provider.  Do stretching exercises and massage your neck as directed by your health care provider.  Keep all follow-up visits as directed by your health care provider. This is important. SEEK MEDICAL CARE IF:  You develop a fever. SEEK IMMEDIATE MEDICAL CARE IF:  You develop difficulty breathing.  You develop noisy breathing (stridor).  You start drooling.  You have trouble swallowing or have pain with swallowing.  You develop numbness or weakness in your hands or feet.  You have changes in your speech, understanding, or vision.  Your pain gets worse. This information is not intended to replace advice given to you by your health care provider. Make sure you discuss any questions you have with your health care provider. Document Released: 05/14/2000 Document Revised: 09/08/2015  Document Reviewed: 05/13/2014 Elsevier Interactive Patient Education  2017 [employer]  Start baclofen every 8 hours for 1 week.  Naproxen every 12 hours with food 1 week, then prn for pain. Start tomorrow.  I referred you to Dr Katrinka BlazingSmith, DO for OMT.

## 2016-04-28 ENCOUNTER — Telehealth: Payer: Self-pay | Admitting: *Deleted

## 2016-04-28 ENCOUNTER — Encounter (HOSPITAL_COMMUNITY): Payer: Self-pay

## 2016-04-28 ENCOUNTER — Emergency Department (HOSPITAL_COMMUNITY): Payer: No Typology Code available for payment source

## 2016-04-28 ENCOUNTER — Emergency Department (HOSPITAL_COMMUNITY)
Admission: EM | Admit: 2016-04-28 | Discharge: 2016-04-28 | Disposition: A | Discharge status: Home / Self Care | Payer: No Typology Code available for payment source | Attending: Emergency Medicine | Admitting: Emergency Medicine

## 2016-04-28 DIAGNOSIS — M542 Cervicalgia: Secondary | ICD-10-CM

## 2016-04-28 DIAGNOSIS — Z79899 Other long term (current) drug therapy: Secondary | ICD-10-CM | POA: Diagnosis not present

## 2016-04-28 DIAGNOSIS — M436 Torticollis: Secondary | ICD-10-CM

## 2016-04-28 MED ORDER — DIAZEPAM 5 MG PO TABS
5.0000 mg | ORAL_TABLET | Freq: Once | ORAL | Status: AC
  Administered 2016-04-28: 5 mg via ORAL
  Filled 2016-04-28: qty 1

## 2016-04-28 MED ORDER — OXYCODONE HCL 5 MG PO TABS
5.0000 mg | ORAL_TABLET | Freq: Once | ORAL | Status: AC
  Administered 2016-04-28: 5 mg via ORAL
  Filled 2016-04-28: qty 1

## 2016-04-28 MED ORDER — DIAZEPAM 2 MG PO TABS: 2.0000 mg | ORAL_TABLET | Freq: Two times a day (BID) | ORAL | 0 refills | Status: DC

## 2016-04-28 MED ORDER — ACETAMINOPHEN 500 MG PO TABS
1000.0000 mg | ORAL_TABLET | Freq: Once | ORAL | Status: AC
  Administered 2016-04-28: 1000 mg via ORAL
  Filled 2016-04-28: qty 2

## 2016-04-28 MED ORDER — KETOROLAC TROMETHAMINE 60 MG/2ML IM SOLN
30.0000 mg | Freq: Once | INTRAMUSCULAR | Status: AC
  Administered 2016-04-28: 30 mg via INTRAMUSCULAR
  Filled 2016-04-28: qty 2

## 2016-04-28 MED ORDER — OXYCODONE HCL 5 MG PO TABS: 5.0000 mg | ORAL_TABLET | ORAL | 0 refills | Status: DC | PRN

## 2016-04-28 NOTE — ED Triage Notes (Signed)
Patient complains of not feeling right for 1 day post visit to her MD for neck pain. States that she had toradol and taken meds as directed. Hyperventilating on arrival and appears very anxious. Alert and oriented

## 2016-04-28 NOTE — Telephone Encounter (Signed)
Spoke with Dr Claiborne BillingsKuneff regarding patient symptoms. Advised patient to go to ER for evaluation of symptoms per Dr Claiborne BillingsKuneff. Patient agreed.

## 2016-04-28 NOTE — Discharge Instructions (Signed)

## 2016-04-28 NOTE — ED Provider Notes (Signed)
[facility]-EMERGENCY DEPT Provider Note   CSN: [id number] Arrival date & time: 04/28/16  1652  By signing my name below, I, [name], attest that this documentation has been prepared under the direction and in the presence of Melene Plan, DO . Electronically Signed: [name], Scribe. 04/28/2016. 5:15 PM.  History   Chief Complaint Chief Complaint  Patient presents with  . Neck Pain    The history is provided by the patient. No language interpreter was used.  Illness  This is a new problem. The current episode started 2 days ago. The problem has been gradually worsening. Pertinent negatives include no chest pain, no headaches and no shortness of breath. The symptoms are aggravated by twisting.     HPI Comments:  Nancy Berry is a 50 y.o. female with a history of anxiety and torticollis, who presents to the Emergency Department complaining of moderate -severe neck pain that began a couple of days ago. Pt is tearful and husband states she is not quite acting herself after taking Baclofen at PCPs office PTA. Pt has no other acute  complaints or symptoms at this time.    Past Medical History:  Diagnosis Date  . Anxiety   . Elevated BP   . Insomnia   . Onychomycosis   . Perimenopause     Patient Active Problem List   Diagnosis Date Noted  . Acute torticollis 04/27/2016  . Vitamin D insufficiency 12/05/2015  . Abnormal mammogram 08/28/2015  . Fatigue 08/19/2015  . Generalized muscle ache 08/19/2015  . Need for hepatitis C screening test 08/19/2015  . Encounter for screening for HIV 08/19/2015  . Weight gain 08/19/2015  . Colon cancer screening 08/19/2015  . Right knee pain 07/29/2015  . Blood pressure elevated 12/30/2013  . Anxiety 12/28/2013    Past Surgical History:  Procedure Laterality Date  . CESAREAN SECTION      OB History    Gravida Para Term Preterm AB Living   3 3           SAB TAB Ectopic Multiple Live Births                   Home Medications      Prior to Admission medications   Medication Sig Start Date End Date Taking? Authorizing Provider  baclofen (LIORESAL) 20 MG tablet Take 1 tablet (20 mg total) by mouth 3 (three) times daily. 04/27/16   Renee A Kuneff, DO  diazepam (VALIUM) 2 MG tablet Take 1 tablet (2 mg total) by mouth 2 (two) times daily. 04/28/16   Melene Plan, DO  LORazepam (ATIVAN) 0.5 MG tablet Take 1 tablet (0.5 mg total) by mouth 2 (two) times daily as needed for anxiety. 12/05/15   Renee A Kuneff, DO  naproxen (NAPROSYN) 500 MG tablet Take 1 tablet (500 mg total) by mouth 2 (two) times daily with a meal. 04/27/16   Renee A Kuneff, DO  oxyCODONE (ROXICODONE) 5 MG immediate release tablet Take 1 tablet (5 mg total) by mouth every 4 (four) hours as needed for severe pain. 04/28/16   Melene Plan, DO  Probiotic Product (PROBIOTIC DAILY PO) Take by mouth.    Historical Provider, MD  traMADol (ULTRAM) 50 MG tablet Take 1 tablet (50 mg total) by mouth every 8 (eight) hours as needed. 04/13/16   Natalia Leatherwood, DO    Family History Family History  Problem Relation Age of Onset  . Breast cancer Mother 25  . Colon cancer Maternal Grandmother  7475    Social History Social History  Substance Use Topics  . Smoking status: Never Smoker  . Smokeless tobacco: Never Used  . Alcohol use No     Allergies   Patient has no known allergies.   Review of Systems Review of Systems  Constitutional: Negative for chills and fever.  HENT: Negative for congestion and rhinorrhea.   Eyes: Negative for redness and visual disturbance.  Respiratory: Negative for shortness of breath and wheezing.   Cardiovascular: Negative for chest pain and palpitations.  Gastrointestinal: Negative for nausea and vomiting.  Genitourinary: Negative for dysuria and urgency.  Musculoskeletal: Positive for neck pain. Negative for arthralgias and myalgias.  Skin: Negative for pallor and wound.  Neurological: Negative for dizziness and headaches.      Physical Exam Updated Vital Signs BP 122/80 (BP Location: Right Arm)   Pulse 79   Temp 98.2 F (36.8 C) (Oral)   Resp 20   SpO2 97%   Physical Exam  Constitutional: She is oriented to person, place, and time. She appears well-developed and well-nourished. No distress.  HENT:  Head: Normocephalic and atraumatic.  Eyes: EOM are normal. Pupils are equal, round, and reactive to light.  Neck: Normal range of motion. Neck supple.  Cardiovascular: Normal rate and regular rhythm.  Exam reveals no gallop and no friction rub.   No murmur heard. Pulmonary/Chest: Effort normal. She has no wheezes. She has no rales.  Abdominal: Soft. She exhibits no distension. There is no tenderness.  Musculoskeletal: She exhibits tenderness. She exhibits no edema.  Tenderness to the left midline about C-5,6,7 Pain with rotation to either direction  5/5 BUE strength  Pulses, motor,  and sensation intact  Neurological: She is alert and oriented to person, place, and time.  Skin: Skin is warm and dry. She is not diaphoretic.  Psychiatric: She has a normal mood and affect. Her behavior is normal.  Nursing note and vitals reviewed.    ED Treatments / Results  DIAGNOSTIC STUDIES:  Oxygen Saturation is 99% on RA, normal by my interpretation.    COORDINATION OF CARE:  5:13 PM Discussed treatment plan with pt at bedside and pt agreed to plan.  Labs (all labs ordered are listed, but only abnormal results are displayed) Labs Reviewed - No data to display  EKG  EKG Interpretation None       Radiology Ct Cervical Spine Wo Contrast  Result Date: 04/28/2016 CLINICAL DATA:  Posterior left-sided neck pain. EXAM: CT CERVICAL SPINE WITHOUT CONTRAST TECHNIQUE: Multidetector CT imaging of the cervical spine was performed without intravenous contrast. Multiplanar CT image reconstructions were also generated. COMPARISON:  None. FINDINGS: Alignment: AP alignment is anatomic. There straightening of the  normal cervical lordosis. Skull base and vertebrae: Craniocervical junction is within normal limits. Endplate degenerative changes are most evident at C6-7 but also seen at C5-6. No acute fracture is present. Soft tissues and spinal canal: The soft tissues the neck are unremarkable. No spinal canal mass is evident. Disc levels:  C2-3:  Negative. C3-4:  Negative. C4-5:  Negative. C5-6: A rightward disc osteophyte complex effaces the ventral CSF. Moderate right and mild left foraminal stenosis is present. C6-7: A rightward disc osteophyte complex is present. There is partial effacement ventral CSF. Moderate right and mild left foraminal stenosis is present. C7-T1:  Negative. Upper chest: The lung apices are clear. IMPRESSION: 1. Rightward disc osteophyte complex at C5-6 and C6-7 with mild to moderate right central canal stenosis at both levels. 2. Moderate  right and mild left foraminal stenosis at C5-6 and C6-7. Electronically Signed   By: Marin Robertshristopher  Mattern M.D.   On: 04/28/2016 18:29    Procedures Procedures (including critical care time)  Medications Ordered in ED Medications  diazepam (VALIUM) tablet 5 mg (5 mg Oral Given 04/28/16 1720)  ketorolac (TORADOL) injection 30 mg (30 mg Intramuscular Given 04/28/16 1721)  acetaminophen (TYLENOL) tablet 1,000 mg (1,000 mg Oral Given 04/28/16 1721)  oxyCODONE (Oxy IR/ROXICODONE) immediate release tablet 5 mg (5 mg Oral Given 04/28/16 1721)     Initial Impression / Assessment and Plan / ED Course  I have reviewed the triage vital signs and the nursing notes.  Pertinent labs & imaging results that were available during my care of the patient were reviewed by me and considered in my medical decision making (see chart for details).  Clinical Course     50 yo F With a chief complaint of left-sided neck pain. This been going on the past couple days. Patient was seen and there PCP's office and started on baclofen for neck spasm. Patient was severe and  worsening pain today. She is tearful on exam. Pain is worse to the left lateral musculature. I suspect that she has torticollis. It is severe pain a CT scan of the C-spine was ordered. This is negative for acute fracture or acute malalignment. Neuro exam of the upper extremities is unremarkable. Patient's symptoms immediately improved with pain medicine. Discharge home.  7:58 PM:  I have discussed the diagnosis/risks/treatment options with the patient and family and believe the pt to be eligible for discharge home to follow-up with PCP. We also discussed returning to the ED immediately if new or worsening sx occur. We discussed the sx which are most concerning (e.g., sudden worsening pain, fever, inability to tolerate by mouth) that necessitate immediate return. Medications administered to the patient during their visit and any new prescriptions provided to the patient are listed below.  Medications given during this visit Medications  diazepam (VALIUM) tablet 5 mg (5 mg Oral Given 04/28/16 1720)  ketorolac (TORADOL) injection 30 mg (30 mg Intramuscular Given 04/28/16 1721)  acetaminophen (TYLENOL) tablet 1,000 mg (1,000 mg Oral Given 04/28/16 1721)  oxyCODONE (Oxy IR/ROXICODONE) immediate release tablet 5 mg (5 mg Oral Given 04/28/16 1721)     The patient appears reasonably screen and/or stabilized for discharge and I doubt any other medical condition or other [facility] requiring further screening, evaluation, or treatment in the ED at this time prior to discharge.   Final Clinical Impressions(s) / ED Diagnoses   Final diagnoses:  Neck pain  Torticollis, acute    New Prescriptions Discharge Medication List as of 04/28/2016  7:21 PM    START taking these medications   Details  diazepam (VALIUM) 2 MG tablet Take 1 tablet (2 mg total) by mouth 2 (two) times daily., Starting Wed 04/28/2016, Print    oxyCODONE (ROXICODONE) 5 MG immediate release tablet Take 1 tablet (5 mg total) by mouth every 4  (four) hours as needed for severe pain., Starting Wed 04/28/2016, Print        I personally performed the services described in this documentation, which was scribed in my presence. The recorded information has been reviewed and is accurate.     Melene Planan Jayleen Scaglione, DO 04/28/16 1958

## 2016-04-28 NOTE — Telephone Encounter (Signed)
Patient left message stating she is having tingling in her arms and twitching of her hands. She states her pain is worse and baclofen is not working. Her appt with Dr Katrinka BlazingSmith is not until next week. Please advise

## 2016-05-07 ENCOUNTER — Ambulatory Visit: Payer: No Typology Code available for payment source | Admitting: Family Medicine

## 2016-09-01 ENCOUNTER — Other Ambulatory Visit: Payer: Self-pay | Admitting: Family Medicine

## 2016-09-01 DIAGNOSIS — G8929 Other chronic pain: Secondary | ICD-10-CM

## 2016-09-01 DIAGNOSIS — M25561 Pain in right knee: Principal | ICD-10-CM

## 2016-09-01 MED ORDER — TRAMADOL HCL 50 MG PO TABS: 50.0000 mg | ORAL_TABLET | Freq: Three times a day (TID) | ORAL | 0 refills | Status: DC | PRN

## 2016-09-01 NOTE — Progress Notes (Signed)
Medication ordered

## 2016-09-02 LAB — RESULTS CONSOLE HPV: CHL HPV: NEGATIVE

## 2016-09-02 LAB — HM PAP SMEAR: HM Pap smear: NORMAL

## 2016-12-08 ENCOUNTER — Ambulatory Visit (INDEPENDENT_AMBULATORY_CARE_PROVIDER_SITE_OTHER): Payer: No Typology Code available for payment source | Admitting: Family Medicine

## 2016-12-08 ENCOUNTER — Encounter: Payer: Self-pay | Admitting: Family Medicine

## 2016-12-08 ENCOUNTER — Other Ambulatory Visit: Payer: Self-pay | Admitting: Family Medicine

## 2016-12-08 VITALS — BP 122/81 | HR 69 | Temp 98.3°F | Resp 20 | Ht 65.0 in | Wt 165.5 lb

## 2016-12-08 DIAGNOSIS — Z13220 Encounter for screening for lipoid disorders: Secondary | ICD-10-CM | POA: Diagnosis not present

## 2016-12-08 DIAGNOSIS — M25561 Pain in right knee: Principal | ICD-10-CM

## 2016-12-08 DIAGNOSIS — G8929 Other chronic pain: Secondary | ICD-10-CM

## 2016-12-08 DIAGNOSIS — E559 Vitamin D deficiency, unspecified: Secondary | ICD-10-CM

## 2016-12-08 DIAGNOSIS — Z130 Encounter for screening for diseases of the blood and blood-forming organs and certain disorders involving the immune mechanism: Secondary | ICD-10-CM | POA: Diagnosis not present

## 2016-12-08 DIAGNOSIS — Z0000 Encounter for general adult medical examination without abnormal findings: Secondary | ICD-10-CM

## 2016-12-08 DIAGNOSIS — Z1211 Encounter for screening for malignant neoplasm of colon: Secondary | ICD-10-CM

## 2016-12-08 DIAGNOSIS — E7800 Pure hypercholesterolemia, unspecified: Secondary | ICD-10-CM | POA: Diagnosis not present

## 2016-12-08 DIAGNOSIS — Z131 Encounter for screening for diabetes mellitus: Secondary | ICD-10-CM

## 2016-12-08 LAB — LIPID PANEL
CHOLESTEROL: 231 mg/dL — AB (ref 0–200)
HDL: 62.8 mg/dL (ref >39.00)
LDL Cholesterol: 149 mg/dL — ABNORMAL HIGH (ref 0–99)
NonHDL: 168.02
Total CHOL/HDL Ratio: 4
Triglycerides: 96 mg/dL (ref 0.0–149.0)
VLDL: 19.2 mg/dL (ref 0.0–40.0)

## 2016-12-08 LAB — COMPREHENSIVE METABOLIC PANEL
ALBUMIN: 4.3 g/dL (ref 3.5–5.2)
ALT: 13 U/L (ref 0–35)
AST: 15 U/L (ref 0–37)
Alkaline Phosphatase: 49 U/L (ref 39–117)
BUN: 13 mg/dL (ref 6–23)
CALCIUM: 9.6 mg/dL (ref 8.4–10.5)
CO2: 30 mEq/L (ref 19–32)
CREATININE: 0.98 mg/dL (ref 0.40–1.20)
Chloride: 103 mEq/L (ref 96–112)
GFR: 63 mL/min (ref >60.00)
Glucose, Bld: 92 mg/dL (ref 70–99)
POTASSIUM: 4.8 meq/L (ref 3.5–5.1)
SODIUM: 141 meq/L (ref 135–145)
TOTAL PROTEIN: 7.3 g/dL (ref 6.0–8.3)
Total Bilirubin: 0.6 mg/dL (ref 0.2–1.2)

## 2016-12-08 LAB — HEMOGLOBIN A1C: HEMOGLOBIN A1C: 5.5 % (ref 4.6–6.5)

## 2016-12-08 LAB — CBC WITH DIFFERENTIAL/PLATELET
Basophils Absolute: 0.1 10*3/uL (ref 0.0–0.1)
Basophils Relative: 1.3 % (ref 0.0–3.0)
EOS PCT: 1 % (ref 0.0–5.0)
Eosinophils Absolute: 0 10*3/uL (ref 0.0–0.7)
HCT: 46.4 % — ABNORMAL HIGH (ref 36.0–46.0)
HEMOGLOBIN: 15.8 g/dL — AB (ref 12.0–15.0)
Lymphocytes Relative: 32.7 % (ref 12.0–46.0)
Lymphs Abs: 1.5 10*3/uL (ref 0.7–4.0)
MCHC: 34 g/dL (ref 30.0–36.0)
MCV: 92.8 fl (ref 78.0–100.0)
MONO ABS: 0.5 10*3/uL (ref 0.1–1.0)
MONOS PCT: 9.6 % (ref 3.0–12.0)
Neutro Abs: 2.6 10*3/uL (ref 1.4–7.7)
Neutrophils Relative %: 55.4 % (ref 43.0–77.0)
Platelets: 279 10*3/uL (ref 150.0–400.0)
RBC: 5.01 Mil/uL (ref 3.87–5.11)
RDW: 13.3 % (ref 11.5–15.5)
WBC: 4.7 10*3/uL (ref 4.0–10.5)

## 2016-12-08 LAB — VITAMIN D 25 HYDROXY (VIT D DEFICIENCY, FRACTURES): VITD: 27.14 ng/mL — AB (ref 30.00–100.00)

## 2016-12-08 LAB — TSH: TSH: 1.9 u[IU]/mL (ref 0.35–4.50)

## 2016-12-08 MED ORDER — TRAMADOL HCL 50 MG PO TABS: 50.0000 mg | ORAL_TABLET | Freq: Three times a day (TID) | ORAL | 0 refills | Status: DC | PRN

## 2016-12-08 NOTE — Progress Notes (Signed)
Patient ID: Nancy Berry, female  DOB: 03/27/1966, 51 y.o.   MRN: [id number] Patient Care Team    Relationship Specialty Notifications Start End  Ma Hillock, DO PCP - General Family Medicine  07/29/15   Avon Gully, NP Nurse Practitioner Obstetrics and Gynecology  08/19/15     Chief Complaint  Patient presents with  . Annual Exam    Subjective:  Nancy Berry is a 51 y.o.  Female  present for CPE. All past medical history, surgical history, allergies, family history, immunizations, medications and social history were updated in the electronic medical record today. All recent labs, ED visits and hospitalizations within the last year were reviewed.  Encounter for chronic pain/knee pain: Indication for chronic opioid: Chronic right knee pain/arthritis Medication and dose: Tramadol 50 mg every 8 hours when necessary  # pills per month: # 60, typically last her a couple months Last UDS date: never Pain contract signed (Y/N): N, briefly discussed, patient will be required to sign one on follow-up given request came after CPE today by  Her Echart message. Date narcotic database last reviewed (include red flags): Yes, 12/08/2016. Appropriate.  Health maintenance:  Colonoscopy: Has a family history of colon cancer in her maternal grandmother (37). She has been referred to gastroenterology to have her colonoscopy has scheduled a colonoscopy 3 times and has canceled. She would like to try the Cologuard screening instead. Pro/Cons discussed with her today. Pt verbalized understanding.  Mammogram: Patient states she has a mammogram scheduled in 3 weeks at her gynecologist office. Cervical cancer screening: last pap: 07/2016, results: "normal", completed by: Nancy Desanctis, NP--> no records received.  Immunizations: tdap 02/08/2014, Influenza declined (encouraged yearly). Could consider shingles shot if desired.  Infectious disease screening: HIV and Hep C 08/19/2015 DEXA: N/A Assistive device:  None Oxygen use: None Patient has a Dental home. Hospitalizations/ED visits: None  Depression screen [facility] 2/9 12/08/2016 08/19/2015  Decreased Interest 0 0  Down, Depressed, Hopeless 0 0  PHQ - 2 Score 0 0   No flowsheet data found.  Current Exercise Habits: Home exercise routine, Type of exercise: walking, Time (Minutes): 30, Frequency (Times/Week): 3, Weekly Exercise (Minutes/Week): 90, Intensity: Mild    Immunization History  Administered Date(s) Administered  . Tdap 02/08/2014    Past Medical History:  Diagnosis Date  . Acute torticollis 2017  . Anxiety   . Insomnia   . Onychomycosis   . Perimenopause    No Known Allergies Past Surgical History:  Procedure Laterality Date  . CESAREAN SECTION     Family History  Problem Relation Age of Onset  . Breast cancer Mother 28  . Colon cancer Maternal Grandmother 75   Social History   Social History  . Marital status: Married    Spouse name: N/A  . Number of children: N/A  . Years of education: N/A   Occupational History  . Not on file.   Social History Main Topics  . Smoking status: Never Smoker  . Smokeless tobacco: Never Used  . Alcohol use No  . Drug use: No  . Sexual activity: Yes     Comment: "withdrawal"   Other Topics Concern  . Not on file   Social History Narrative   Married, husband Ron. 2 children Haze Boyden, [place])    AAS degree, Loss analyst    Drinks caffeine, uses herbal remedies.    Wears seatbelt, exercises >3x a week, smoke detector in the home   Firearms  In the  home (locked cabinets)   Feels safe in her relationships.       Allergies as of 12/08/2016   No Known Allergies     Medication List       Accurate as of 12/08/16 12:50 PM. Always use your most recent med list.          naproxen 500 MG tablet Commonly known as:  NAPROSYN Take 1 tablet (500 mg total) by mouth 2 (two) times daily with a meal.   PROBIOTIC DAILY PO Take by mouth.   traMADol 50 MG tablet Commonly known  as:  ULTRAM Take 1 tablet (50 mg total) by mouth every 8 (eight) hours as needed.      All past medical history, surgical history, allergies, family history, immunizations andmedications were updated in the EMR today and reviewed under the history and medication portions of their EMR.     No results found for this or any previous visit (from the past 2160 hour(s)).  ROS: 14 pt review of systems performed and negative (unless mentioned in an HPI)  Objective: BP 122/81 (BP Location: Right Arm, Patient Position: Sitting, Cuff Size: Large)   Pulse 69   Temp 98.3 F (36.8 C)   Resp 20   Ht '5\' 5"'$  (1.651 m)   Wt 165 lb 8 oz (75.1 kg)   SpO2 99%   BMI 27.54 kg/m  Gen: Afebrile. No acute distress. Nontoxic in appearance, well-developed, well-nourished,  pleasant caucasian female.  HENT: AT. [place]. Bilateral TM visualized and normal in appearance, normal external auditory canal. MMM, no oral lesions, adequate dentition. Bilateral nares within normal limits. Throat without erythema, ulcerations or exudates. no Cough on exam, no hoarseness on exam. Eyes:Pupils Equal Round Reactive to light, Extraocular movements intact,  Conjunctiva without redness, discharge or icterus. Neck/lymp/endocrine: Supple,no lymphadenopathy, no thyromegaly CV: RRR no murmur, no edema, +2/4 P posterior tibialis pulses. no carotid bruits. No JVD. Chest: CTAB, no wheeze, rhonchi or crackles. Normal  Respiratory effort. good Air movement. Abd: Soft. round. NTND. BS present. no Masses palpated. No hepatosplenomegaly. No rebound tenderness or guarding. Skin: no rashes, purpura or petechiae. Warm and well-perfused. Skin intact. Neuro/Msk:  Normal gait. PERLA. EOMi. Alert. Oriented x3.  Cranial nerves II through XII intact. Muscle strength 5/5 upper/lower extremity. DTRs equal bilaterally. Psych: Normal affect, dress and demeanor. Normal speech. Normal thought content and judgment.   No exam data  present  Assessment/plan: Nancy Berry is a 51 y.o. female present for CPE. Vitamin D insufficiency - Vitamin D (25 hydroxy) Screening for diabetes mellitus - HgB A1c Screening for iron deficiency anemia - CBC w/Diff Encounter for preventive health examination Patient was encouraged to exercise greater than 150 minutes a week. Patient was encouraged to choose a diet filled with fresh fruits and vegetables, and lean meats. AVS provided to patient today for education/recommendation on gender specific health and safety maintenance. - Patient has mammogram scheduled.  - We'll need records from gynecology on both Pap results and mammogram, these will need to be abstracted. Screening is up-to-date. - Colon cancer screening ordered today. - All other immunizations and screenings desired have been completed. - Comp Met (CMET) Colon cancer screening - Cologuard ordered today Elevated LDL cholesterol level - Comp Met (CMET) - Lipid panel - TSH Chronic pain of right knee/encounter for chronic pain management - agreed to refill tramadol today x1, patient was educated on need of contract and face-to-face encounter every 6 months for this issue alone. Request came after patient  went for her CPE today via [place].  - Future refills will need an appointment with this provider. - traMADol (ULTRAM) 50 MG tablet; Take 1 tablet (50 mg total) by mouth every 8 (eight) hours as needed.  Dispense: 60 tablet; Refill: 0   Return in about 1 year (around 12/08/2017) for CPE.  Electronically signed by: Howard Pouch, DO [place]

## 2016-12-08 NOTE — Patient Instructions (Signed)
It was great to see you today.  I am glad you are doing so well.   Health Maintenance, Female Adopting a healthy lifestyle and getting preventive care can go a long way to promote health and wellness. Talk with your health care provider about what schedule of regular examinations is right for you. This is a good chance for you to check in with your provider about disease prevention and staying healthy. In between checkups, there are plenty of things you can do on your own. Experts have done a lot of research about which lifestyle changes and preventive measures are most likely to keep you healthy. Ask your health care provider for more information. Weight and diet Eat a healthy diet  Be sure to include plenty of vegetables, fruits, low-fat dairy products, and lean protein.  Do not eat a lot of foods high in solid fats, added sugars, or salt.  Get regular exercise. This is one of the most important things you can do for your health. ? Most adults should exercise for at least 150 minutes each week. The exercise should increase your heart rate and make you sweat (moderate-intensity exercise). ? Most adults should also do strengthening exercises at least twice a week. This is in addition to the moderate-intensity exercise.  Maintain a healthy weight  Body mass index (BMI) is a measurement that can be used to identify possible weight problems. It estimates body fat based on height and weight. Your health care provider can help determine your BMI and help you achieve or maintain a healthy weight.  For females 47 years of age and older: ? A BMI below 18.5 is considered underweight. ? A BMI of 18.5 to 24.9 is normal. ? A BMI of 25 to 29.9 is considered overweight. ? A BMI of 30 and above is considered obese.  Watch levels of cholesterol and blood lipids  You should start having your blood tested for lipids and cholesterol at 53 years of age, then have this test every 5 years.  You may need to  have your cholesterol levels checked more often if: ? Your lipid or cholesterol levels are high. ? You are older than 53 years of age. ? You are at high risk for heart disease.  Cancer screening Lung Cancer  Lung cancer screening is recommended for adults 60-9 years old who are at high risk for lung cancer because of a history of smoking.  A yearly low-dose CT scan of the lungs is recommended for people who: ? Currently smoke. ? Have quit within the past 15 years. ? Have at least a 30-pack-year history of smoking. A pack year is smoking an average of one pack of cigarettes a day for 1 year.  Yearly screening should continue until it has been 15 years since you quit.  Yearly screening should stop if you develop a health problem that would prevent you from having lung cancer treatment.  Breast Cancer  Practice breast self-awareness. This means understanding how your breasts normally appear and feel.  It also means doing regular breast self-exams. Let your health care provider know about any changes, no matter how small.  If you are in your 20s or 30s, you should have a clinical breast exam (CBE) by a health care provider every 1-3 years as part of a regular health exam.  If you are 29 or older, have a CBE every year. Also consider having a breast X-ray (mammogram) every year.  If you have a family history of  breast cancer, talk to your health care provider about genetic screening.  If you are at high risk for breast cancer, talk to your health care provider about having an MRI and a mammogram every year.  Breast cancer gene (BRCA) assessment is recommended for women who have family members with BRCA-related cancers. BRCA-related cancers include: ? Breast. ? Ovarian. ? Tubal. ? Peritoneal cancers.  Results of the assessment will determine the need for genetic counseling and BRCA1 and BRCA2 testing.  Cervical Cancer Your health care provider may recommend that you be screened  regularly for cancer of the pelvic organs (ovaries, uterus, and vagina). This screening involves a pelvic examination, including checking for microscopic changes to the surface of your cervix (Pap test). You may be encouraged to have this screening done every 3 years, beginning at age 60.  For women ages 14-65, health care providers may recommend pelvic exams and Pap testing every 3 years, or they may recommend the Pap and pelvic exam, combined with testing for human papilloma virus (HPV), every 5 years. Some types of HPV increase your risk of cervical cancer. Testing for HPV may also be done on women of any age with unclear Pap test results.  Other health care providers may not recommend any screening for nonpregnant women who are considered low risk for pelvic cancer and who do not have symptoms. Ask your health care provider if a screening pelvic exam is right for you.  If you have had past treatment for cervical cancer or a condition that could lead to cancer, you need Pap tests and screening for cancer for at least 20 years after your treatment. If Pap tests have been discontinued, your risk factors (such as having a new sexual partner) need to be reassessed to determine if screening should resume. Some women have medical problems that increase the chance of getting cervical cancer. In these cases, your health care provider may recommend more frequent screening and Pap tests.  Colorectal Cancer  This type of cancer can be detected and often prevented.  Routine colorectal cancer screening usually begins at 53 years of age and continues through 54 years of age.  Your health care provider may recommend screening at an earlier age if you have risk factors for colon cancer.  Your health care provider may also recommend using home test kits to check for hidden blood in the stool.  A small camera at the end of a tube can be used to examine your colon directly (sigmoidoscopy or colonoscopy). This is  done to check for the earliest forms of colorectal cancer.  Routine screening usually begins at age 57.  Direct examination of the colon should be repeated every 5-10 years through 53 years of age. However, you may need to be screened more often if early forms of precancerous polyps or small growths are found.  Skin Cancer  Check your skin from head to toe regularly.  Tell your health care provider about any new moles or changes in moles, especially if there is a change in a mole's shape or color.  Also tell your health care provider if you have a mole that is larger than the size of a pencil eraser.  Always use sunscreen. Apply sunscreen liberally and repeatedly throughout the day.  Protect yourself by wearing long sleeves, pants, a wide-brimmed hat, and sunglasses whenever you are outside.  Heart disease, diabetes, and high blood pressure  High blood pressure causes heart disease and increases the risk of stroke. High blood  pressure is more likely to develop in: ? People who have blood pressure in the high end of the normal range (130-139/85-89 mm Hg). ? People who are overweight or obese. ? People who are African American.  If you are 54-31 years of age, have your blood pressure checked every 3-5 years. If you are 55 years of age or older, have your blood pressure checked every year. You should have your blood pressure measured twice-once when you are at a hospital or clinic, and once when you are not at a hospital or clinic. Record the average of the two measurements. To check your blood pressure when you are not at a hospital or clinic, you can use: ? An automated blood pressure machine at a pharmacy. ? A home blood pressure monitor.  If you are between 97 years and 44 years old, ask your health care provider if you should take aspirin to prevent strokes.  Have regular diabetes screenings. This involves taking a blood sample to check your fasting blood sugar level. ? If you are  at a normal weight and have a low risk for diabetes, have this test once every three years after 53 years of age. ? If you are overweight and have a high risk for diabetes, consider being tested at a younger age or more often. Preventing infection Hepatitis B  If you have a higher risk for hepatitis B, you should be screened for this virus. You are considered at high risk for hepatitis B if: ? You were born in a country where hepatitis B is common. Ask your health care provider which countries are considered high risk. ? Your parents were born in a high-risk country, and you have not been immunized against hepatitis B (hepatitis B vaccine). ? You have HIV or AIDS. ? You use needles to inject street drugs. ? You live with someone who has hepatitis B. ? You have had sex with someone who has hepatitis B. ? You get hemodialysis treatment. ? You take certain medicines for conditions, including cancer, organ transplantation, and autoimmune conditions.  Hepatitis C  Blood testing is recommended for: ? Everyone born from [birth year] through 1967. ? Anyone with known risk factors for hepatitis C.  Sexually transmitted infections (STIs)  You should be screened for sexually transmitted infections (STIs) including gonorrhea and chlamydia if: ? You are sexually active and are younger than 53 years of age. ? You are older than 53 years of age and your health care provider tells you that you are at risk for this type of infection. ? Your sexual activity has changed since you were last screened and you are at an increased risk for chlamydia or gonorrhea. Ask your health care provider if you are at risk.  If you do not have HIV, but are at risk, it may be recommended that you take a prescription medicine daily to prevent HIV infection. This is called pre-exposure prophylaxis (PrEP). You are considered at risk if: ? You are sexually active and do not regularly use condoms or know the HIV status of your  partner(s). ? You take drugs by injection. ? You are sexually active with a partner who has HIV.  Talk with your health care provider about whether you are at high risk of being infected with HIV. If you choose to begin PrEP, you should first be tested for HIV. You should then be tested every 3 months for as long as you are taking PrEP. Pregnancy  If you are premenopausal  and you may become pregnant, ask your health care provider about preconception counseling.  If you may become pregnant, take 400 to 800 micrograms (mcg) of folic acid every day.  If you want to prevent pregnancy, talk to your health care provider about birth control (contraception). Osteoporosis and menopause  Osteoporosis is a disease in which the bones lose minerals and strength with aging. This can result in serious bone fractures. Your risk for osteoporosis can be identified using a bone density scan.  If you are 66 years of age or older, or if you are at risk for osteoporosis and fractures, ask your health care provider if you should be screened.  Ask your health care provider whether you should take a calcium or vitamin D supplement to lower your risk for osteoporosis.  Menopause may have certain physical symptoms and risks.  Hormone replacement therapy may reduce some of these symptoms and risks. Talk to your health care provider about whether hormone replacement therapy is right for you. Follow these instructions at home:  Schedule regular health, dental, and eye exams.  Stay current with your immunizations.  Do not use any tobacco products including cigarettes, chewing tobacco, or electronic cigarettes.  If you are pregnant, do not drink alcohol.  If you are breastfeeding, limit how much and how often you drink alcohol.  Limit alcohol intake to no more than 1 drink per day for nonpregnant women. One drink equals 12 ounces of beer, 5 ounces of wine, or 1 ounces of hard liquor.  Do not use street  drugs.  Do not share needles.  Ask your health care provider for help if you need support or information about quitting drugs.  Tell your health care provider if you often feel depressed.  Tell your health care provider if you have ever been abused or do not feel safe at home. This information is not intended to replace advice given to you by your health care provider. Make sure you discuss any questions you have with your health care provider. Document Released: 11/30/2010 Document Revised: 10/23/2015 Document Reviewed: 02/18/2015 Elsevier Interactive Patient Education  [employer].

## 2016-12-09 ENCOUNTER — Encounter: Payer: Self-pay | Admitting: Family Medicine

## 2016-12-09 ENCOUNTER — Telehealth: Payer: Self-pay | Admitting: Family Medicine

## 2016-12-09 NOTE — Telephone Encounter (Signed)
Spoke with patient reviewed lab results and instructions. Patient will consider getting the shingles vaccine and will call back if she decides to get it. Patient states she has not been consistent taking her Vitamin D but will start taking 1000 units daily.

## 2016-12-09 NOTE — Telephone Encounter (Signed)
Please call pt - Her vit D is mildly insufficient. At 27--> like around 40. She has been on a supplement, it is not in her med list. Please ask her the dose. If she is taking [phone], I would recommend she double it. If she is not routinely taking her supplement, I recommend 1000u. - her cholesterol is mildly increased from last year. I would encouraged increase exercise, lean baked meats/salmon and plenty of fresh vegetables, higher fiber diet. She could also take a fish oil supplement. It is not high enough I would recommend   a prescribed med yet.  - lastly, we did not talk about the shingles vaccine during her appt. The new vaccine is recommend > 50, it is a two step vac and she desires she can have that administered here by nurse visit or at her local pharmacy.

## 2016-12-10 ENCOUNTER — Encounter: Payer: No Typology Code available for payment source | Admitting: Family Medicine

## 2016-12-21 LAB — COLOGUARD: Cologuard: NEGATIVE

## 2016-12-23 ENCOUNTER — Encounter: Payer: Self-pay | Admitting: *Deleted

## 2016-12-23 LAB — HM MAMMOGRAPHY

## 2017-01-03 ENCOUNTER — Telehealth: Payer: Self-pay | Admitting: Family Medicine

## 2017-01-03 NOTE — Telephone Encounter (Signed)
Please call pt:  her colo-guard test was negative. Please update health maintenance tab with results 12/21/2016 (ABSTRACT)

## 2017-01-04 NOTE — Telephone Encounter (Signed)
Spoke with patient reviewed results. 

## 2017-02-07 ENCOUNTER — Ambulatory Visit (INDEPENDENT_AMBULATORY_CARE_PROVIDER_SITE_OTHER): Payer: No Typology Code available for payment source | Admitting: Family Medicine

## 2017-02-07 ENCOUNTER — Encounter: Payer: Self-pay | Admitting: Family Medicine

## 2017-02-07 VITALS — BP 117/80 | HR 70 | Temp 97.9°F | Resp 16 | Wt 163.0 lb

## 2017-02-07 DIAGNOSIS — G8929 Other chronic pain: Secondary | ICD-10-CM | POA: Diagnosis not present

## 2017-02-07 DIAGNOSIS — M542 Cervicalgia: Secondary | ICD-10-CM | POA: Diagnosis not present

## 2017-02-07 DIAGNOSIS — M25561 Pain in right knee: Secondary | ICD-10-CM

## 2017-02-07 MED ORDER — CYCLOBENZAPRINE HCL 5 MG PO TABS: 5.0000 mg | ORAL_TABLET | Freq: Three times a day (TID) | ORAL | 1 refills | Status: DC | PRN

## 2017-02-07 MED ORDER — TRAMADOL HCL 50 MG PO TABS: 50.0000 mg | ORAL_TABLET | Freq: Three times a day (TID) | ORAL | 5 refills | Status: DC | PRN

## 2017-02-07 MED ORDER — METHYLPREDNISOLONE ACETATE 80 MG/ML IJ SUSP
80.0000 mg | Freq: Once | INTRAMUSCULAR | Status: AC
  Administered 2017-02-07: 80 mg via INTRAMUSCULAR

## 2017-02-07 NOTE — Progress Notes (Signed)
Nancy BealSherri L Berry , [birth year], 51 y.o., female MRN: [id number] Patient Care Team    Relationship Specialty Notifications Start End  Natalia LeatherwoodKuneff, Aziya Arena A, DO PCP - General Family Medicine  07/29/15   Lynden AngLane, Elizabeth, NP Nurse Practitioner Obstetrics and Gynecology  08/19/15     Chief Complaint  Patient presents with  . Neck Pain    neck and shoulder spasms x 1 day     Subjective: Pt presents for an OV with complaints of neck and shoulder spasms of 1 day  duration.  Associated symptoms include neck stiffness bilateral neck and shoulders. She had a similar episode a few months back, that she ended up in the ED. It resolved after 3 days of muscle relaxer and pain medications. She denies any increase in activity or lifting. She denies fever, chills or headache. She tried to take one of her tramadol, which eased the pain, but did not take it away. She feels more stiff than in pain compared to last time. She is worried it will progress again.  She still had right knee pain, but usually only with vacations/long walking or increase in activity. She is in need of refills of her tramadol today.    Encounter for chronic pain:  Indication for chronic opioid: Knee pain, neck pain Medication and dose: tramadol #60, 50 -100 mg q 8 hr prn # pills per month: #60 Last UDS date: N/A Pain contract signed (Y/N): Yes Date narcotic database last reviewed (include red flags): 02/07/2017   Depression screen [facility] 2[facility] 12/08/2016 08/19/2015  Decreased Interest 0 0  Down, Depressed, Hopeless 0 0  PHQ - 2 Score 0 0    No Known Allergies Social History  Substance Use Topics  . Smoking status: Never Smoker  . Smokeless tobacco: Never Used  . Alcohol use No   Past Medical History:  Diagnosis Date  . Acute torticollis 2017  . Anxiety   . Insomnia   . Onychomycosis   . Perimenopause    Past Surgical History:  Procedure Laterality Date  . CESAREAN SECTION     Family History  Problem Relation Age of Onset  .  Breast cancer Mother [id number]  . Colon cancer Maternal Grandmother 75   Allergies as of 02/07/2017   No Known Allergies     Medication List       Accurate as of 02/07/17 10:21 AM. Always use your most recent med list.          naproxen 500 MG tablet Commonly known as:  NAPROSYN Take 1 tablet (500 mg total) by mouth 2 (two) times daily with a meal.   PROBIOTIC DAILY PO Take by mouth.   traMADol 50 MG tablet Commonly known as:  ULTRAM Take 1 tablet (50 mg total) by mouth every 8 (eight) hours as needed.   Vitamin D3 1000 units Caps Take 1 capsule by mouth daily.       All past medical history, surgical history, allergies, family history, immunizations andmedications were updated in the EMR today and reviewed under the history and medication portions of their EMR.     ROS: Negative, with the exception of above mentioned in HPI   Objective:  BP 117/80 (BP Location: Left Arm, Patient Position: Sitting, Cuff Size: Normal)   Pulse 70   Temp 97.9 F (36.6 C) (Oral)   Resp 16   Wt 163 lb (73.9 kg)   SpO2 97%   BMI 27.12 kg/m  Body mass index is 27.12 kg/m. Gen:  Afebrile. No acute distress. Nontoxic in appearance, well developed, well nourished.  Neck/lymp/endocrine: Supple,no lymphadenopathy MSK: no erythema, no soft tissue swelling. No bone tenderness cervical spine. Bilateral upper trap tightness. No TTP. Decreased ROM 2/2 discomfort in all planes. NV intact distally.  Skin: no rashes, purpura or petechiae.  Neuro:  Normal gait. PERLA. EOMi. Alert. Oriented x3 Cranial nerves II through XII intact. Muscle strength 5/5 bilateral upper extremity. DTRs equal bilaterally.   No exam data present No results found. No results found for this or any previous visit (from the past 24 hour(s)).  Assessment/Plan: Nancy Berry is a 51 y.o. female present for OV for  Chronic pain of right knee Encounter for chronic pain management Cervical spine pain Acute on chronic pain.  Treatment with heat, steroid injection, muscle relaxer, stretches and refills on tramadol.  Indication for chronic opioid: Knee pain, neck pain Medication and dose: tramadol #60, 50 -100 mg q 8 hr prn # pills per month: #60 Last UDS date: N/A Pain contract signed (Y/N): Yes Date narcotic database last reviewed (include red flags): 02/07/2017, made part of her permanent chart. - traMADol (ULTRAM) 50 MG tablet; Take 1-2 tablets (50-100 mg total) by mouth every 8 (eight) hours as needed.  Dispense: 60 tablet; Refill: 5 - cyclobenzaprine (FLEXERIL) 5 MG tablet; Take 1 tablet (5 mg total) by mouth 3 (three) times daily as needed for muscle spasms.  Dispense: 30 tablet; Refill: 1 - methylPREDNISolone acetate (DEPO-MEDROL) injection 80 mg; Inject 1 mL (80 mg total) into the muscle once. - F/u 2 weeks, sooner if worsening.    Reviewed expectations re: course of current medical issues.  Discussed self-management of symptoms.  Outlined signs and symptoms indicating need for more acute intervention.  Patient verbalized understanding and all questions were answered.  Patient received an After-Visit Summary.    No orders of the defined types were placed in this encounter.    Note is dictated utilizing voice recognition software. Although note has been proof read prior to signing, occasional typographical errors still can be missed. If any questions arise, please do not hesitate to call for verification.   electronically signed by:  Felix Pacini, DO   [facility] - [place]

## 2017-02-07 NOTE — Patient Instructions (Signed)
Take muscle relaxer at night first, if able to tolerate can take every 8 hours, but may cause sedation. At least take before bed for next 3 days.  Heat application for 15 minutes at a time only.  Tramadol refilled, with contract signed. Follow up every 6 months for this refills if needed.    If neck is not improved in 2 weeks, or worsening please follow up.    Start light stretches of neck in 1-2 days.

## 2017-02-10 ENCOUNTER — Ambulatory Visit: Payer: No Typology Code available for payment source | Admitting: Family Medicine

## 2017-05-26 IMAGING — CT CT CERVICAL SPINE W/O CM
3 of 4 series · 12 of 33 positions shown, 14 images · non-contrast
Comparison: None.

CLINICAL DATA: Posterior left-sided neck pain.

EXAM:
CT CERVICAL SPINE WITHOUT CONTRAST
TECHNIQUE: Multidetector CT imaging of the cervical spine was performed without
intravenous contrast. Multiplanar CT image reconstructions were also
generated.

[Series 4: c_spine 2.0 st · axial · 0.23mm/px · z∈[-280,-152]mm · 4 of 96 slices shown, 5 images]
[im 16/96  soft-tissue]
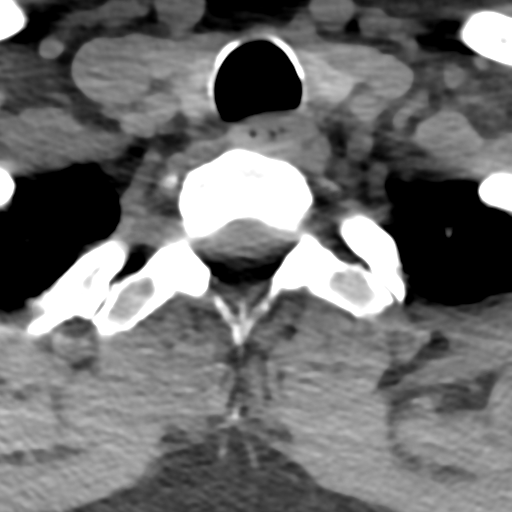
[im 16/96  bone]
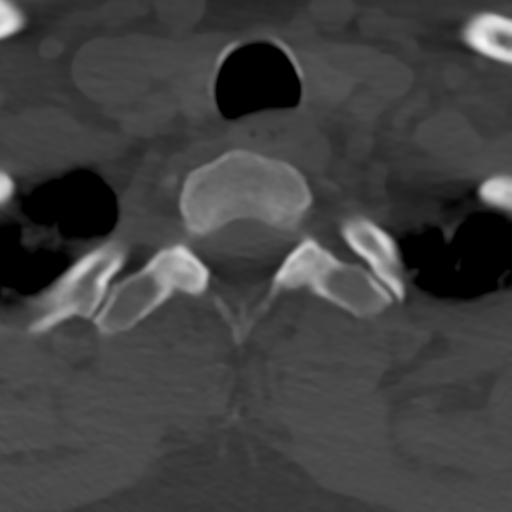
[im 32/96  bone]
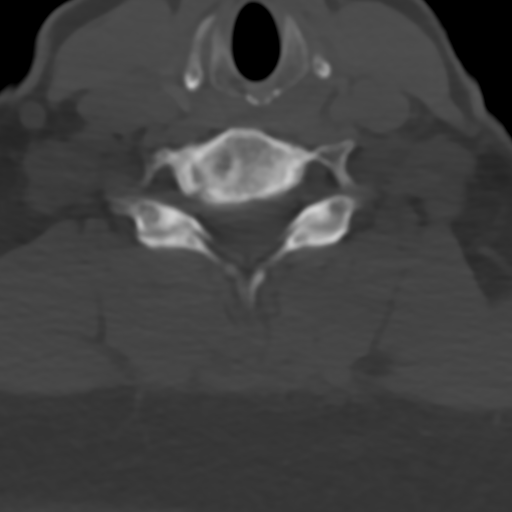
[im 64/96  bone]
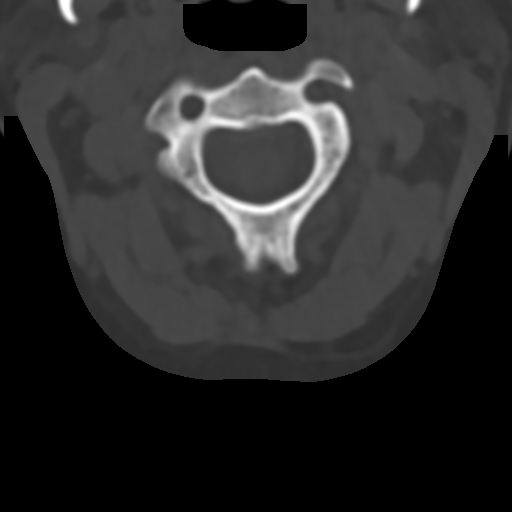
[im 80/96  bone]
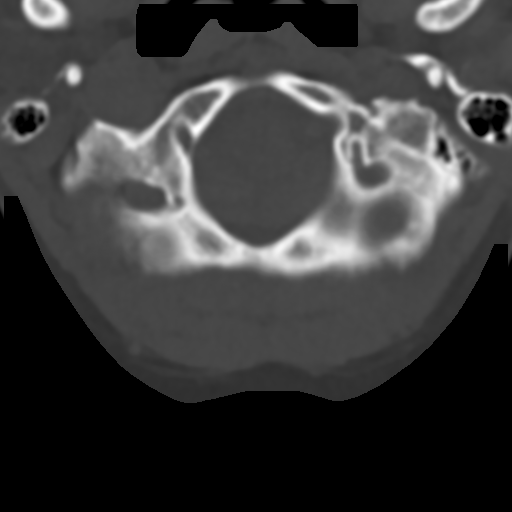

[Series 6: c_spine 2.0 cor bone · coronal · 0.23mm/px · 3 of 61 slices shown]
[im 13/61  bone]
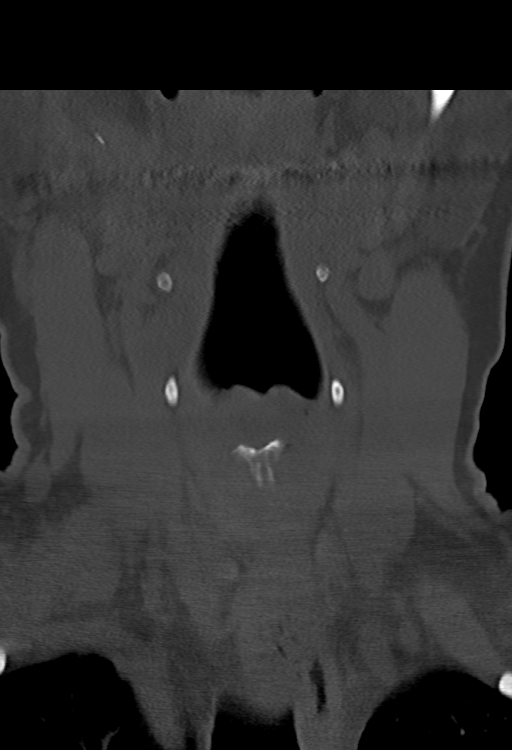
[im 25/61  bone]
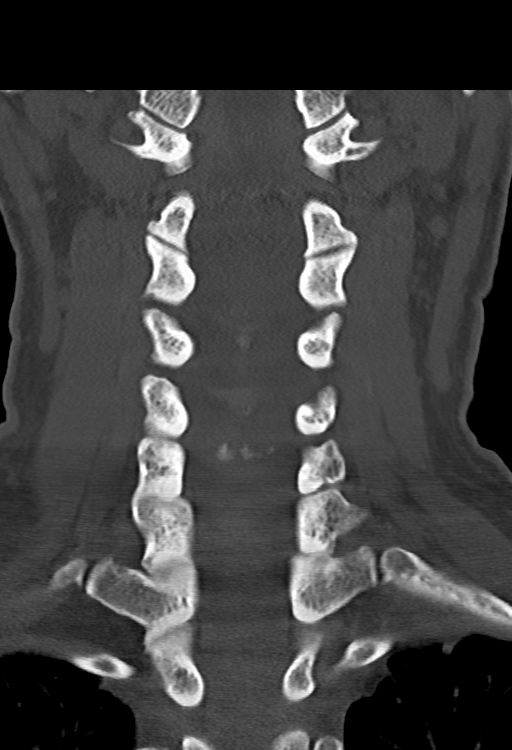
[im 37/61  bone]
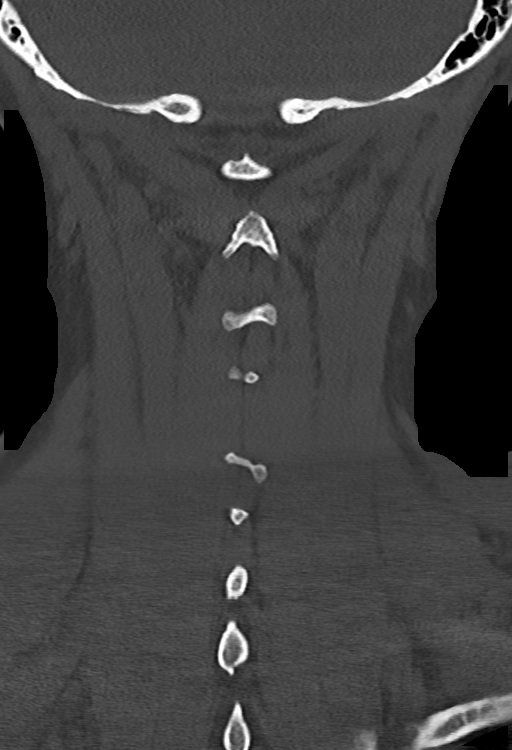

[Series 7: c_spine 2.0 sag bone · sagittal · 0.22mm/px · 5 of 46 slices shown, 6 images]
[im 16/46  bone]
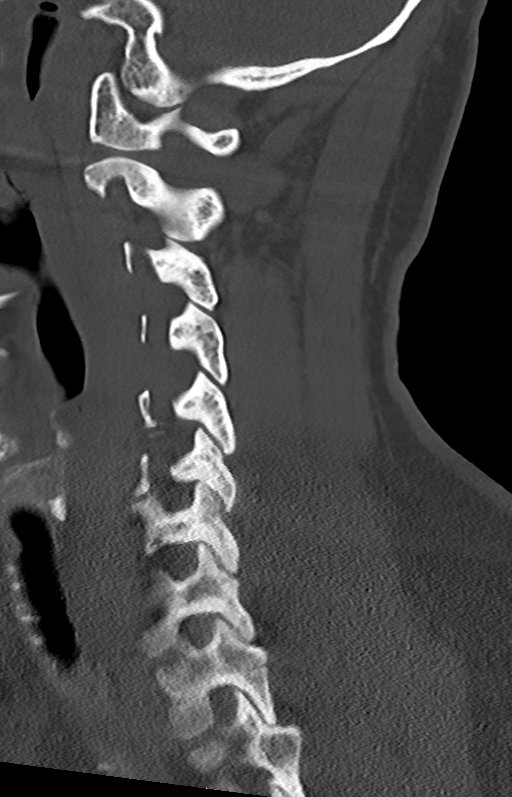
[im 19/46  bone]
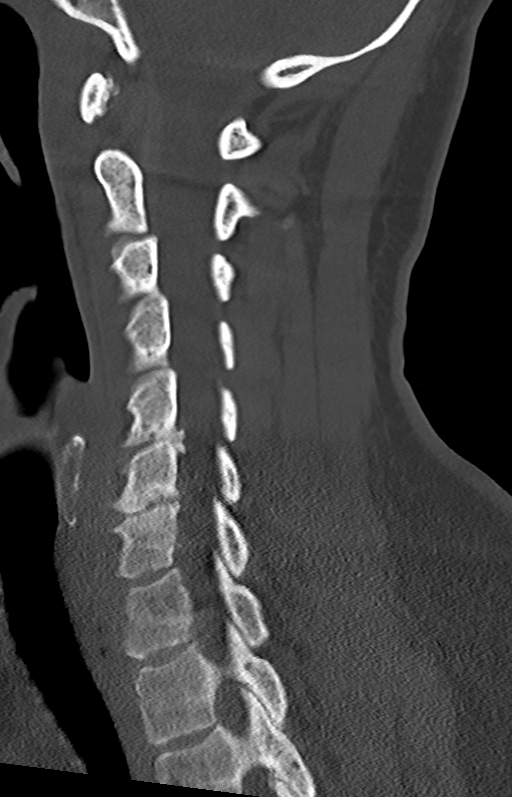
[im 23/46  soft-tissue]
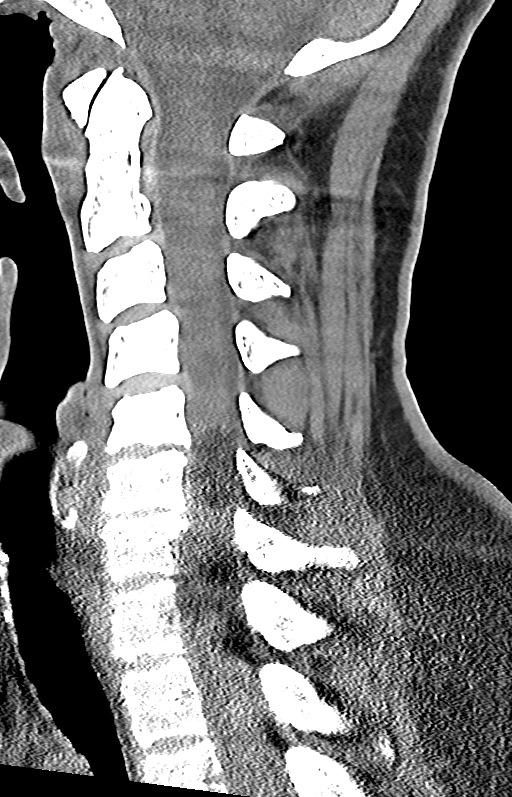
[im 23/46  bone]
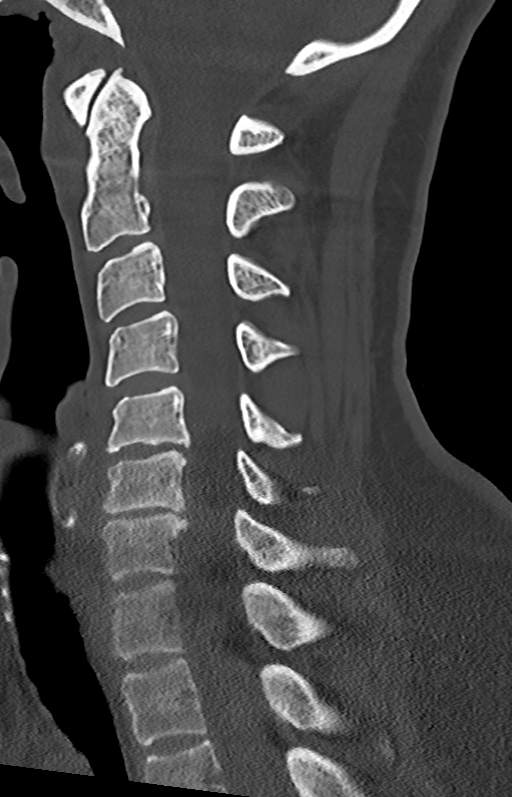
[im 27/46  bone]
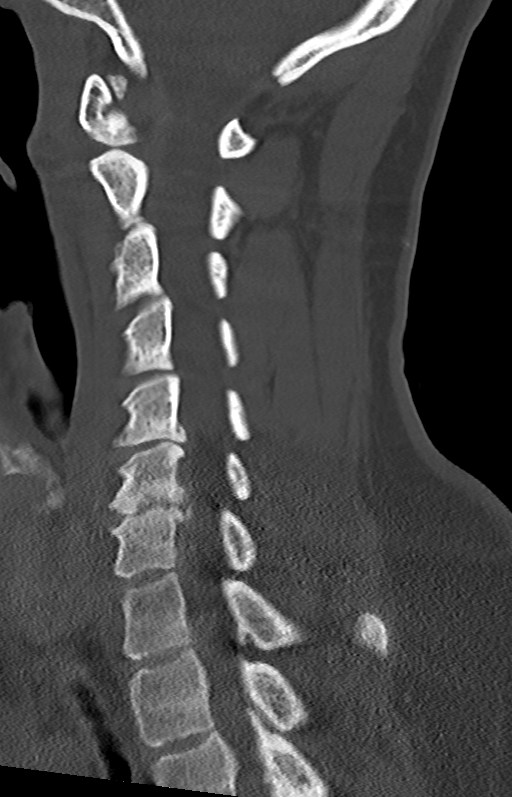
[im 31/46  bone]
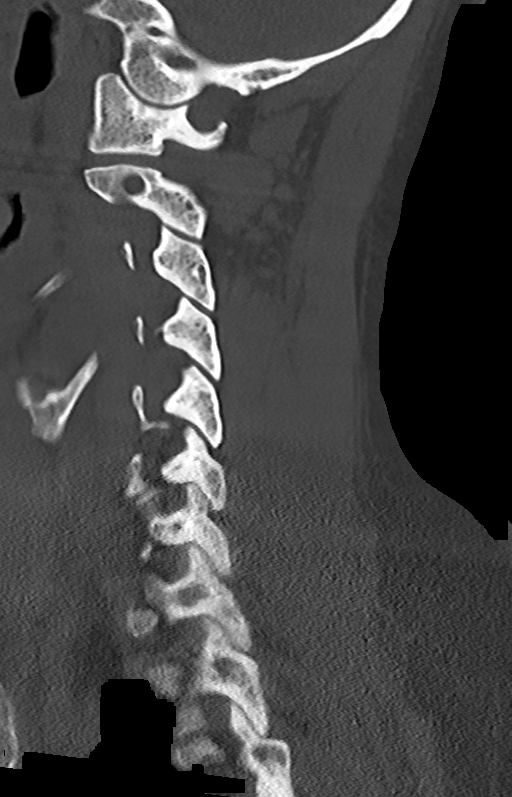

[12 of 33 positions shown; findings below may reference images not displayed]

FINDINGS: Alignment: AP alignment is anatomic. There straightening of the
normal cervical lordosis.

Skull base and vertebrae: Craniocervical junction is within normal
limits. Endplate degenerative changes are most evident at C6-7 but
also seen at C5-6. No acute fracture is present.

Soft tissues and spinal canal: The soft tissues the neck are
unremarkable. No spinal canal mass is evident.

Disc levels:  C2-3:  Negative.

C3-4:  Negative.

C4-5:  Negative.

C5-6: A rightward disc osteophyte complex effaces the ventral CSF.
Moderate right and mild left foraminal stenosis is present.

C6-7: A rightward disc osteophyte complex is present. There is
partial effacement ventral CSF. Moderate right and mild left
foraminal stenosis is present.

C7-T1:  Negative.

Upper chest: The lung apices are clear.
IMPRESSION: 1. Rightward disc osteophyte complex at C5-6 and C6-7 with mild to
moderate right central canal stenosis at both levels.
2. Moderate right and mild left foraminal stenosis at C5-6 and C6-7.

## 2017-08-30 ENCOUNTER — Ambulatory Visit: Payer: Self-pay | Admitting: *Deleted

## 2017-08-30 NOTE — Telephone Encounter (Signed)
   Reason for Disposition . Cough  Answer Assessment - Initial Assessment Questions 1. ONSET: "When did the cough begin?"      Sunday 2. SEVERITY: "How bad is the cough today?"      Getting worse 3. RESPIRATORY DISTRESS: "Describe your breathing."      normal 4. FEVER: "Do you have a fever?" If so, ask: "What is your temperature, how was it measured, and when did it start?"     no 5. HEMOPTYSIS: "Are you coughing up any blood?" If so ask: "How much?" (flecks, streaks, tablespoons, etc.)     no 6. TREATMENT: "What have you done so far to treat the cough?" (e.g., meds, fluids, humidifier)     meds 7. CARDIAC HISTORY: "Do you have any history of heart disease?" (e.g., heart attack, congestive heart failure)      no 8. LUNG HISTORY: "Do you have any history of lung disease?"  (e.g., pulmonary embolus, asthma, emphysema)     no 9. PE RISK FACTORS: "Do you have a history of blood clots?" (or: recent major surgery, recent prolonged travel, bedridden )     no 10. OTHER SYMPTOMS: "Do you have any other symptoms? (e.g., runny nose, wheezing, chest pain)       none 11. PREGNANCY: "Is there any chance you are pregnant?" "When was your last menstrual period?"       no 12. TRAVEL: "Have you traveled out of the country in the last month?" (e.g., travel history, exposures)       no  Protocols used: COUGH - ACUTE NON-PRODUCTIVE-A-AH  Pt requesting information regarding what to take over the counter for a cough. She denies fever, body aches, sore throat or runny nose. She feels like she is coughing more but non productive. She does not want an appointment. Home care advice given to patient with verbal understanding.  Advised to call back for any cold or flu symptoms. Pt voiced understanding.

## 2017-08-30 NOTE — Telephone Encounter (Signed)
Dr Claiborne BillingsKuneff aware Triage note and advice given to patient by triage nurse.

## 2018-02-17 ENCOUNTER — Encounter: Payer: Self-pay | Admitting: *Deleted

## 2018-02-17 ENCOUNTER — Telehealth: Payer: Self-pay | Admitting: Family Medicine

## 2018-02-17 NOTE — Telephone Encounter (Signed)
Patient has not been seen since 02/07/17. This is a controlled medication and requires an office visit. Sent patient a message in MY Chart.

## 2018-02-17 NOTE — Telephone Encounter (Signed)
Copied from CRM [phone]. Topic: Quick Communication - Rx Refill/Question >> Feb 17, 2018  9:09 AM Alexander BergeronBarksdale, Harvey B wrote: Medication: traMADol (ULTRAM) 50 MG tablet [[id number]][[id number]]   Has the patient contacted their pharmacy? Yes.   (Agent: If no, request that the patient contact the pharmacy for the refill.) (Agent: If yes, when and what did the pharmacy advise?)  Preferred Pharmacy (with phone number or street name): [facility]  Agent: Please be advised that RX refills may take up to 3 business days. We ask that you follow-up with your pharmacy.

## 2018-02-22 ENCOUNTER — Encounter: Payer: No Typology Code available for payment source | Admitting: Family Medicine

## 2018-03-02 ENCOUNTER — Encounter: Payer: Self-pay | Admitting: *Deleted

## 2018-03-03 ENCOUNTER — Encounter: Payer: No Typology Code available for payment source | Admitting: Family Medicine

## 2018-03-06 ENCOUNTER — Ambulatory Visit: Payer: No Typology Code available for payment source | Admitting: Family Medicine

## 2018-03-06 ENCOUNTER — Encounter: Payer: Self-pay | Admitting: Family Medicine

## 2018-03-06 VITALS — BP 131/89 | HR 82 | Temp 98.3°F | Resp 20 | Ht 65.0 in | Wt 166.5 lb

## 2018-03-06 DIAGNOSIS — G8929 Other chronic pain: Secondary | ICD-10-CM

## 2018-03-06 DIAGNOSIS — M25561 Pain in right knee: Secondary | ICD-10-CM

## 2018-03-06 MED ORDER — TRAMADOL HCL 50 MG PO TABS: 50.0000 mg | ORAL_TABLET | Freq: Two times a day (BID) | ORAL | 5 refills | Status: DC | PRN

## 2018-03-06 NOTE — Progress Notes (Signed)
Nancy Berry , [birth year], 52 y.o., female MRN: [id number] Patient Care Team    Relationship Specialty Notifications Start End  Natalia Leatherwood, DO PCP - General Family Medicine  07/29/15   Lynden Ang, NP Nurse Practitioner Obstetrics and Gynecology  08/19/15     Chief Complaint  Patient presents with  . Neck Pain     Subjective: Nancy Berry is a 52 y.o. female    Encounter for chronic pain: pt presents for follow up on chronic pain of her knees. Her neck pain has resolved. She reports she uses the tramadol 50 mg once a day every few days. It works well for her pain and improves her QOL, allowing her exercise and function in her job.  Indication for chronic opioid: Knee pain Medication and dose: tramadol #60, 50 BID PRN # pills per month: #60 Last UDS date: N/A Pain contract signed (Y/N): Yes Date narcotic database last reviewed (include red flags): 03/06/18   Depression screen [facility] 2/9 03/06/2018 12/08/2016 08/19/2015  Decreased Interest 0 0 0  Down, Depressed, Hopeless 0 0 0  PHQ - 2 Score 0 0 0    Allergies  Allergen Reactions  . Baclofen Other (See Comments)    "loopy"    Social History   Tobacco Use  . Smoking status: Never Smoker  . Smokeless tobacco: Never Used  Substance Use Topics  . Alcohol use: No    Alcohol/week: 0.0 standard drinks   Past Medical History:  Diagnosis Date  . Acute torticollis 2017  . Anxiety   . Insomnia   . Onychomycosis   . Perimenopause    Past Surgical History:  Procedure Laterality Date  . CESAREAN SECTION     Family History  Problem Relation Age of Onset  . Breast cancer Mother 35  . Colon cancer Maternal Grandmother 75   Allergies as of 03/06/2018      Reactions   Baclofen Other (See Comments)   "loopy"       Medication List        Accurate as of 03/06/18  2:36 PM. Always use your most recent med list.          PROBIOTIC DAILY PO Take by mouth.   traMADol 50 MG tablet Commonly known as:   ULTRAM Take 1-2 tablets (50-100 mg total) by mouth every 8 (eight) hours as needed.   Vitamin D3 1000 units Caps Take 1 capsule by mouth daily.       All past medical history, surgical history, allergies, family history, immunizations andmedications were updated in the EMR today and reviewed under the history and medication portions of their EMR.     ROS: Negative, with the exception of above mentioned in HPI   Objective:  BP 131/89 (BP Location: Right Arm, Patient Position: Sitting, Cuff Size: Large)   Pulse 82   Temp 98.3 F (36.8 C)   Resp 20   Ht 5\' 5"  (1.651 m)   Wt 166 lb 8 oz (75.5 kg)   LMP 07/02/2015   SpO2 99%   BMI 27.71 kg/m  Body mass index is 27.71 kg/m. Gen: Afebrile. No acute distress.  HENT: AT. [place].  MMM.  Eyes:Pupils Equal Round Reactive to light, Extraocular movements intact,  Conjunctiva without redness, discharge or icterus. MSK: no erythema of knee, no swelling currently. Normal gait. No TTP. NV intact distally.  Neuro: Normal gait. PERLA. EOMi. Alert. Oriented. Psych: Normal affect, dress and demeanor. Normal speech. Normal thought content and  judgment.  No exam data present No results found. No results found for this or any previous visit (from the past 24 hour(s)).  Assessment/Plan: Nancy Berry is a 52 y.o. female present for OV for  Chronic pain of right knee Encounter for chronic pain management Cervical spine pain Stable. Chronic pain. Improved QOL Indication for chronic opioid: Knee pain Medication and dose: tramadol #60, 50 mg BID PRN # pills per month: #60 Last UDS date: N/A Pain contract signed (Y/N): Yes Date narcotic database last reviewed (include red flags): 03/06/18, made part of her permanent chart. - traMADol (ULTRAM) 50 MG tablet; Take 1-2 tablets (50-100 mg total) by mouth every 8 (eight) hours as needed.  Dispense: 60 tablet; Refill: 5 - F/u q 6 months.    Reviewed expectations re: course of current medical  issues.  Discussed self-management of symptoms.  Outlined signs and symptoms indicating need for more acute intervention.  Patient verbalized understanding and all questions were answered.  Patient received an After-Visit Summary.    No orders of the defined types were placed in this encounter.    Note is dictated utilizing voice recognition software. Although note has been proof read prior to signing, occasional typographical errors still can be missed. If any questions arise, please do not hesitate to call for verification.   electronically signed by:  Felix Pacini, DO   Primary Care - [place]

## 2018-03-06 NOTE — Patient Instructions (Signed)
I have refilled your medication for you.  Follow up every 6 months if needing refills.

## 2018-03-14 ENCOUNTER — Ambulatory Visit (INDEPENDENT_AMBULATORY_CARE_PROVIDER_SITE_OTHER): Payer: BLUE CROSS/BLUE SHIELD | Admitting: Family Medicine

## 2018-03-14 ENCOUNTER — Encounter: Payer: Self-pay | Admitting: Family Medicine

## 2018-03-14 VITALS — BP 126/78 | HR 70 | Temp 98.0°F | Resp 20 | Ht 65.0 in | Wt 165.5 lb

## 2018-03-14 DIAGNOSIS — E7800 Pure hypercholesterolemia, unspecified: Secondary | ICD-10-CM | POA: Insufficient documentation

## 2018-03-14 DIAGNOSIS — Z131 Encounter for screening for diabetes mellitus: Secondary | ICD-10-CM | POA: Diagnosis not present

## 2018-03-14 DIAGNOSIS — Z79899 Other long term (current) drug therapy: Secondary | ICD-10-CM

## 2018-03-14 DIAGNOSIS — E663 Overweight: Secondary | ICD-10-CM | POA: Insufficient documentation

## 2018-03-14 DIAGNOSIS — Z0000 Encounter for general adult medical examination without abnormal findings: Secondary | ICD-10-CM

## 2018-03-14 DIAGNOSIS — E559 Vitamin D deficiency, unspecified: Secondary | ICD-10-CM

## 2018-03-14 DIAGNOSIS — Z130 Encounter for screening for diseases of the blood and blood-forming organs and certain disorders involving the immune mechanism: Secondary | ICD-10-CM

## 2018-03-14 DIAGNOSIS — L720 Epidermal cyst: Secondary | ICD-10-CM

## 2018-03-14 NOTE — Progress Notes (Signed)
Patient ID: Nancy Berry, female  DOB: December 29, 1965, 52 y.o.   MRN: [id number] Patient Care Team    Relationship Specialty Notifications Start End  Ma Hillock, DO PCP - General Family Medicine  07/29/15   Avon Gully, NP Nurse Practitioner Obstetrics and Gynecology  08/19/15     Chief Complaint  Patient presents with  . Annual Exam    Subjective:  Nancy Berry is a 52 y.o.  Female  present for CPE. All past medical history, surgical history, allergies, family history, immunizations, medications and social history were updated in the electronic medical record today. All recent labs, ED visits and hospitalizations within the last year were reviewed.  Patient does have an area on her mid back she would like looked at.  It has been there for over a year.  Sometimes she is able to squeeze it and get a drainage out of it.  She denies any pain or redness associated with it.  Health maintenance: updated 03/14/2018 Colonoscopy: cologuard - negative, rpt in 3 years.  Mammogram: completed 12/23/2016 at GYN office. Has scheduled.  Cervical cancer screening: last pap: 09/02/2016 by: GYN Immunizations: tdap 02/08/2014, Influenza declined (encouraged yearly). Shingrix declined.  Infectious disease screening: HIV and Hep C 08/19/2015 DEXA: N/A Assistive device: none Oxygen XKG:YJEH Patient has a Dental home. Hospitalizations/ED visits: reviewed  Depression screen [facility] 2/9 03/14/2018 03/06/2018 12/08/2016 08/19/2015  Decreased Interest 0 0 0 0  Down, Depressed, Hopeless 0 0 0 0  PHQ - 2 Score 0 0 0 0   No flowsheet data found.   Current Exercise Habits: Structured exercise class, Type of exercise: strength training/weights;walking, Time (Minutes): 45, Frequency (Times/Week): 5, Weekly Exercise (Minutes/Week): 225, Intensity: Moderate Exercise limited by: None identified   Immunization History  Administered Date(s) Administered  . Tdap 02/08/2014     Past Medical History:    Diagnosis Date  . Acute torticollis 2017  . Anxiety   . Insomnia   . Onychomycosis   . Perimenopause    Allergies  Allergen Reactions  . Baclofen Other (See Comments)    "loopy"    Past Surgical History:  Procedure Laterality Date  . CESAREAN SECTION     Family History  Problem Relation Age of Onset  . Breast cancer Mother 65  . Colon cancer Maternal Grandmother 60   Social History   Socioeconomic History  . Marital status: Married    Spouse name: Not on file  . Number of children: Not on file  . Years of education: Not on file  . Highest education level: Not on file  Occupational History  . Not on file  Social Needs  . Financial resource strain: Not on file  . Food insecurity:    Worry: Not on file    Inability: Not on file  . Transportation needs:    Medical: Not on file    Non-medical: Not on file  Tobacco Use  . Smoking status: Never Smoker  . Smokeless tobacco: Never Used  Substance and Sexual Activity  . Alcohol use: No    Alcohol/week: 0.0 standard drinks  . Drug use: No  . Sexual activity: Yes    Comment: "withdrawal"  Lifestyle  . Physical activity:    Days per week: Not on file    Minutes per session: Not on file  . Stress: Not on file  Relationships  . Social connections:    Talks on phone: Not on file    Gets together: Not  on file    Attends religious service: Not on file    Active member of club or organization: Not on file    Attends meetings of clubs or organizations: Not on file    Relationship status: Not on file  . Intimate partner violence:    Fear of current or ex partner: Not on file    Emotionally abused: Not on file    Physically abused: Not on file    Forced sexual activity: Not on file  Other Topics Concern  . Not on file  Social History Narrative   Married, husband Ron. 2 children Haze [place], [place])    AAS degree, Loss analyst    Drinks caffeine, uses herbal remedies.    Wears seatbelt, exercises >3x a week, smoke  detector in the home   Firearms  In the home (locked cabinets)   Feels safe in her relationships.    Allergies as of 03/14/2018      Reactions   Baclofen Other (See Comments)   "loopy"       Medication List        Accurate as of 03/14/18 11:59 PM. Always use your most recent med list.          PROBIOTIC DAILY PO Take by mouth.   traMADol 50 MG tablet Commonly known as:  ULTRAM Take 1 tablet (50 mg total) by mouth every 12 (twelve) hours as needed for moderate pain.   Vitamin D3 1000 units Caps Take 1 capsule by mouth daily.       All past medical history, surgical history, allergies, family history, immunizations andmedications were updated in the EMR today and reviewed under the history and medication portions of their EMR.     Recent Results (from the past 2160 hour(s))  CBC w/Diff     Status: None   Collection Time: 03/14/18  2:56 PM  Result Value Ref Range   WBC 7.2 3.8 - 10.8 Thousand/uL   RBC 4.97 3.80 - 5.10 Million/uL   Hemoglobin 15.4 11.7 - 15.5 g/dL   HCT 44.2 35.0 - 45.0 %   MCV 88.9 80.0 - 100.0 fL   MCH 31.0 27.0 - 33.0 pg   MCHC 34.8 32.0 - 36.0 g/dL   RDW 12.7 11.0 - 15.0 %   Platelets 278 140 - 400 Thousand/uL   MPV 10.2 7.5 - 12.5 fL   Neutro Abs 3,866 1,500 - 7,800 cells/uL   Lymphs Abs 2,563 850 - 3,900 cells/uL   WBC mixed population 662 200 - 950 cells/uL   Eosinophils Absolute 79 15 - 500 cells/uL   Basophils Absolute 29 0 - 200 cells/uL   Neutrophils Relative % 53.7 %   Total Lymphocyte 35.6 %   Monocytes Relative 9.2 %   Eosinophils Relative 1.1 %   Basophils Relative 0.4 %  Comp Met (CMET)     Status: Abnormal   Collection Time: 03/14/18  2:56 PM  Result Value Ref Range   Glucose, Bld 63 (L) 65 - 99 mg/dL    Comment: .            Fasting reference interval .    BUN 17 7 - 25 mg/dL   Creat 1.01 0.50 - 1.05 mg/dL    Comment: For patients >71 years of age, the reference limit for Creatinine is approximately 13% higher for  people identified as African-American. .    BUN/Creatinine Ratio NOT APPLICABLE 6 - 22 (calc)   Sodium 139 135 - 146 mmol/L  Potassium 4.3 3.5 - 5.3 mmol/L   Chloride 103 98 - 110 mmol/L   CO2 25 20 - 32 mmol/L   Calcium 9.4 8.6 - 10.4 mg/dL   Total Protein 7.2 6.1 - 8.1 g/dL   Albumin 4.3 3.6 - 5.1 g/dL   Globulin 2.9 1.9 - 3.7 g/dL (calc)   AG Ratio 1.5 1.0 - 2.5 (calc)   Total Bilirubin 0.6 0.2 - 1.2 mg/dL   Alkaline phosphatase (APISO) 42 33 - 130 U/L   AST 20 10 - 35 U/L   ALT 14 6 - 29 U/L  Lipid panel     Status: Abnormal   Collection Time: 03/14/18  2:56 PM  Result Value Ref Range   Cholesterol 199 <200 mg/dL   HDL 58 >50 mg/dL   Triglycerides 83 <150 mg/dL   LDL Cholesterol (Calc) 123 (H) mg/dL (calc)    Comment: Reference range: <100 . Desirable range <100 mg/dL for primary prevention;   <70 mg/dL for patients with CHD or diabetic patients  with > or = 2 CHD risk factors. Marland Kitchen LDL-C is now calculated using the Martin-Hopkins  calculation, which is a validated novel method providing  better accuracy than the Friedewald equation in the  estimation of LDL-C.  Cresenciano Genre et al. Annamaria Helling. 7106;269(48): 2061-2068  (http://education.[facility].com/faq/FAQ164)    Total CHOL/HDL Ratio 3.4 <5.0 (calc)   Non-HDL Cholesterol (Calc) 141 (H) <130 mg/dL (calc)    Comment: For patients with diabetes plus 1 major ASCVD risk  factor, treating to a non-HDL-C goal of <100 mg/dL  (LDL-C of <70 mg/dL) is considered a therapeutic  option.   HgB A1c     Status: None   Collection Time: 03/14/18  2:56 PM  Result Value Ref Range   Hgb A1c MFr Bld 5.5 <5.7 % of total Hgb    Comment: For the purpose of screening for the presence of diabetes: . <5.7%       Consistent with the absence of diabetes 5.7-6.4%    Consistent with increased risk for diabetes             (prediabetes) > or =6.5%  Consistent with diabetes . This assay result is consistent with a decreased risk of  diabetes. . Currently, no consensus exists regarding use of hemoglobin A1c for diagnosis of diabetes in children. . According to American Diabetes Association (ADA) guidelines, hemoglobin A1c <7.0% represents optimal control in non-pregnant diabetic patients. Different metrics may apply to specific patient populations.  Standards of Medical Care in Diabetes(ADA). .    Mean Plasma Glucose 111 (calc)   eAG (mmol/L) 6.2 (calc)  Vitamin D (25 hydroxy)     Status: Abnormal   Collection Time: 03/14/18  2:56 PM  Result Value Ref Range   Vit D, 25-Hydroxy 29 (L) 30 - 100 ng/mL    Comment: Vitamin D Status         25-OH Vitamin D: . Deficiency:                    <20 ng/mL Insufficiency:             20 - 29 ng/mL Optimal:                 > or = 30 ng/mL . For 25-OH Vitamin D testing on patients on  D2-supplementation and patients for whom quantitation  of D2 and D3 fractions is required, the [facility]ureD(TM) 25-OH VIT D, (D2,D3), LC/MS/MS is recommended: order  code 929-400-5757 (patients >  56yr). . For more information on this test, go to: http://education.[facility].com/faq/FAQ163 (This link is being provided for  informational/educational purposes only.)     ROS: 14 pt review of systems performed and negative (unless mentioned in an HPI)  Objective: BP 126/78 (BP Location: Left Arm, Patient Position: Sitting, Cuff Size: Large)   Pulse 70   Temp 98 F (36.7 C)   Resp 20   Ht _0  (1.651 m)   Wt 165 lb 8 oz (75.1 kg)   LMP 07/02/2015   SpO2 98%   BMI 27.54 kg/m  Gen: Afebrile. No acute distress. Nontoxic in appearance, well-developed, well-nourished, overweight, pleasant Caucasian female. HENT: AT. [place]. Bilateral TM visualized and normal in appearance, normal external auditory canal. MMM, no oral lesions, adequate dentition. Bilateral nares within normal limits. Throat without erythema, ulcerations or exudates.  No cough on exam, no hoarseness on exam. Eyes:Pupils Equal  Round Reactive to light, Extraocular movements intact,  Conjunctiva without redness, discharge or icterus. Neck/lymp/endocrine: Supple, no lymphadenopathy, no thyromegaly CV: RRR no murmur, no edema, +2/4 P posterior tibialis pulses.  No carotid bruits. No JVD. Chest: CTAB, no wheeze, rhonchi or crackles.  Normal respiratory effort.  Good air movement. Abd: Soft.  Flat. NTND. BS present.  No masses palpated. No hepatosplenomegaly. No rebound tenderness or guarding. Skin: New rashes, purpura or petechiae. Warm and well-perfused. Skin intact. <0.5cm dermal cyst mid thoracic back just above bra line.  No redness, no drainage, no tenderness. Neuro/Msk:  Normal gait. PERLA. EOMi. Alert. Oriented x3.  Cranial nerves II through XII intact. Muscle strength 5/5 upper/lower extremity. DTRs equal bilaterally. Psych: Normal affect, dress and demeanor. Normal speech. Normal thought content and judgment.  No exam data present  Assessment/plan: SQUINITA KOSTELECKYis a 52y.o. female present for CPE. Elevated LDL cholesterol level/Overweight (BMI 25.0-29.9) - Lipid panel - TSH Screening for diabetes mellitus - HgB A1c Vitamin D insufficiency - taking 1000 u daily, when she remembers - Vitamin D (25 hydroxy) Encounter for long-term (current) use of medications - Comp Met (CMET) Screening for deficiency anemia - CBC w/Diff Epidermal cyst: Very small epidermal cyst mid thoracic back.  Reassured patient.  Gust options with her today, and if it grows or starts to bother her comes red or appears infected she will make an appointment to discuss. Encounter for preventive health examination Patient was encouraged to exercise greater than 150 minutes a week. Patient was encouraged to choose a diet filled with fresh fruits and vegetables, and lean meats. AVS provided to patient today for education/recommendation on gender specific health and safety maintenance. Colonoscopy: cologuard - negative, rpt in 3 years.   Mammogram: completed 12/23/2016 at GYN office. Has scheduled.  Cervical cancer screening: last pap: 09/02/2016 by: GYN Immunizations: tdap 02/08/2014, Influenza declined (encouraged yearly). Shingrix declined.  Infectious disease screening: HIV and Hep C 08/19/2015 DEXA: N/A  Return in about 1 year (around 03/15/2019) for CPE.  Electronically signed by: RHoward Pouch DO [place]

## 2018-03-14 NOTE — Patient Instructions (Signed)

## 2018-03-15 ENCOUNTER — Encounter: Payer: Self-pay | Admitting: Family Medicine

## 2018-03-15 LAB — COMPREHENSIVE METABOLIC PANEL
AG RATIO: 1.5 (calc) (ref 1.0–2.5)
ALKALINE PHOSPHATASE (APISO): 42 U/L (ref 33–130)
ALT: 14 U/L (ref 6–29)
AST: 20 U/L (ref 10–35)
Albumin: 4.3 g/dL (ref 3.6–5.1)
BUN: 17 mg/dL (ref 7–25)
CALCIUM: 9.4 mg/dL (ref 8.6–10.4)
CO2: 25 mmol/L (ref 20–32)
Chloride: 103 mmol/L (ref 98–110)
Creat: 1.01 mg/dL (ref 0.50–1.05)
GLUCOSE: 63 mg/dL — AB (ref 65–99)
Globulin: 2.9 g/dL (calc) (ref 1.9–3.7)
Potassium: 4.3 mmol/L (ref 3.5–5.3)
Sodium: 139 mmol/L (ref 135–146)
Total Bilirubin: 0.6 mg/dL (ref 0.2–1.2)
Total Protein: 7.2 g/dL (ref 6.1–8.1)

## 2018-03-15 LAB — HEMOGLOBIN A1C
Hgb A1c MFr Bld: 5.5 % of total Hgb (ref <5.7)
Mean Plasma Glucose: 111 (calc)
eAG (mmol/L): 6.2 (calc)

## 2018-03-15 LAB — LIPID PANEL
CHOLESTEROL: 199 mg/dL (ref <200)
HDL: 58 mg/dL (ref >50)
LDL Cholesterol (Calc): 123 mg/dL (calc) — ABNORMAL HIGH
NON-HDL CHOLESTEROL (CALC): 141 mg/dL — AB (ref <130)
Total CHOL/HDL Ratio: 3.4 (calc) (ref <5.0)
Triglycerides: 83 mg/dL (ref <150)

## 2018-03-15 LAB — CBC WITH DIFFERENTIAL/PLATELET
BASOS ABS: 29 {cells}/uL (ref 0–200)
Basophils Relative: 0.4 %
EOS ABS: 79 {cells}/uL (ref 15–500)
Eosinophils Relative: 1.1 %
HCT: 44.2 % (ref 35.0–45.0)
Hemoglobin: 15.4 g/dL (ref 11.7–15.5)
Lymphs Abs: 2563 cells/uL (ref 850–3900)
MCH: 31 pg (ref 27.0–33.0)
MCHC: 34.8 g/dL (ref 32.0–36.0)
MCV: 88.9 fL (ref 80.0–100.0)
MONOS PCT: 9.2 %
MPV: 10.2 fL (ref 7.5–12.5)
NEUTROS ABS: 3866 {cells}/uL (ref 1500–7800)
Neutrophils Relative %: 53.7 %
Platelets: 278 10*3/uL (ref 140–400)
RBC: 4.97 10*6/uL (ref 3.80–5.10)
RDW: 12.7 % (ref 11.0–15.0)
TOTAL LYMPHOCYTE: 35.6 %
WBC mixed population: 662 cells/uL (ref 200–950)
WBC: 7.2 10*3/uL (ref 3.8–10.8)

## 2018-03-15 LAB — VITAMIN D 25 HYDROXY (VIT D DEFICIENCY, FRACTURES): Vit D, 25-Hydroxy: 29 ng/mL — ABNORMAL LOW (ref 30–100)

## 2018-03-15 LAB — TSH: TSH: 1.92 u[IU]/mL (ref 0.35–4.50)

## 2018-06-23 ENCOUNTER — Ambulatory Visit (INDEPENDENT_AMBULATORY_CARE_PROVIDER_SITE_OTHER): Payer: BLUE CROSS/BLUE SHIELD | Admitting: Family Medicine

## 2018-06-23 ENCOUNTER — Encounter: Payer: Self-pay | Admitting: Family Medicine

## 2018-06-23 VITALS — BP 106/72 | HR 84 | Temp 99.1°F | Resp 16 | Ht 65.0 in | Wt 168.0 lb

## 2018-06-23 DIAGNOSIS — R509 Fever, unspecified: Secondary | ICD-10-CM

## 2018-06-23 DIAGNOSIS — J101 Influenza due to other identified influenza virus with other respiratory manifestations: Secondary | ICD-10-CM

## 2018-06-23 LAB — POCT INFLUENZA A/B
Influenza A, POC: POSITIVE — AB
Influenza B, POC: NEGATIVE

## 2018-06-23 MED ORDER — AZITHROMYCIN 250 MG PO TABS: ORAL_TABLET | ORAL | 0 refills | Status: DC

## 2018-06-23 MED ORDER — HYDROCODONE-HOMATROPINE 5-1.5 MG/5ML PO SYRP: 5.0000 mL | ORAL_SOLUTION | Freq: Three times a day (TID) | ORAL | 0 refills | Status: DC | PRN

## 2018-06-23 MED ORDER — OSELTAMIVIR PHOSPHATE 75 MG PO CAPS: 75.0000 mg | ORAL_CAPSULE | Freq: Two times a day (BID) | ORAL | 0 refills | Status: DC

## 2018-06-23 NOTE — Progress Notes (Signed)
Nancy Berry , May 26, 1966, 53 y.o., female MRN: [id number] Patient Care Team    Relationship Specialty Notifications Start End  Nancy Berry PCP - General Family Medicine  07/29/15   Nancy Berry Nurse Practitioner Obstetrics and Gynecology  08/19/15     Chief Complaint  Patient presents with  . Cough    x 4 days, non productive and worse at night  . Generalized Body Aches  . Fever    101 on and off     Subjective: Pt presents for an OV with complaints of fever of 2 days duration. Associated symptoms include cough that has been present since URI last months, but it started to worsen 4 days ago. She has had a fever of 101 F, body aches and chills. Pt has tried tylenol/advil to ease their symptoms. She did not get her flu shot this year.  [facility] 03/14/2018 03/06/2018 12/08/2016 08/19/2015  Decreased Interest 0 0 0 0  Down, Depressed, Hopeless 0 0 0 0  PHQ - 2 Score 0 0 0 0    Allergies  Allergen Reactions  . Baclofen Other (See Comments)    "loopy"    Social History   Tobacco Use  . Smoking status: Never Smoker  . Smokeless tobacco: Never Used  Substance Use Topics  . Alcohol use: No    Alcohol/week: 0.0 standard drinks   Past Medical History:  Diagnosis Date  . Acute torticollis 2017  . Anxiety   . Insomnia   . Onychomycosis   . Perimenopause    Past Surgical History:  Procedure Laterality Date  . CESAREAN SECTION     Family History  Problem Relation Age of Onset  . Breast cancer Mother 7575  . Colon cancer Maternal Grandmother 75   Allergies as of 06/23/2018      Reactions   Baclofen Other (See Comments)   "loopy"       Medication List       Accurate as of June 23, 2018  1:42 PM. Always use your most recent med list.        traMADol 50 MG tablet Commonly known as:  ULTRAM Take 1 tablet (50 mg total) by mouth every 12 (twelve) hours as needed for moderate pain.   Vitamin D3 25 MCG (1000 UT) Caps Take 1 capsule  by mouth daily.       All past medical history, surgical history, allergies, family history, immunizations andmedications were updated in the EMR today and reviewed under the history and medication portions of their EMR.     ROS: Negative, with the exception of above mentioned in HPI   Objective:  BP 106/72 (BP Location: Right Arm, Patient Position: Sitting, Cuff Size: Normal)   Pulse 84   Temp 99.1 F (37.3 C) (Oral)   Resp 16   Ht 5\' 5"  (1.651 m)   Wt 168 lb (76.2 kg)   LMP 07/02/2015   SpO2 96%   BMI 27.96 kg/m  Body mass index is 27.96 kg/m. Gen: febrile. No acute distress. Nontoxic in appearance, well developed, well nourished.  HENT: AT. [place]. Bilateral TM visualized mild fullness left TM. MMM, no oral lesions. Bilateral nares with erythema, swelling and drianage. Throat without erythema or exudates. Cough present, PND present.  Eyes:Pupils Equal Round Reactive to light, Extraocular movements intact,  Conjunctiva without redness, discharge or icterus. Neck/lymp/endocrine: Supple,no lymphadenopathy CV: RRR, no edema Chest: CTAB, no wheeze or crackles. Good air movement, normal resp  effort.  Abd: Soft. NTND. BS present. no Masses palpated.  Skin: no rashes, purpura or petechiae.  Neuro: Normal gait. PERLA. EOMi. Alert. Oriented x3  No exam data present No results found. Results for orders placed or performed in visit on 06/23/18 (from the past 24 hour(s))  POCT Influenza A/B     Status: Abnormal   Collection Time: 06/23/18  1:39 PM  Result Value Ref Range   Influenza A, POC Positive (A) Negative   Influenza B, POC Negative Negative    Assessment/Plan: Nancy Berry is a 53 y.o. female present for OV for   Fever, unspecified fever cause - POCT Influenza A/B Influenza A Rest, hydrate.  + flonase, mucinex DM if cough tamiflu  prescribed, take until completed.  z-pack if symptoms worsen or fever remains after 2nd-3rd day of tamiflu.  Hycodan cough syrup prescribed.    If cough present it can last up to 6-8 weeks.  F/U 2 weeks of not improved.    Reviewed expectations re: course of current medical issues.  Discussed self-management of symptoms.  Outlined signs and symptoms indicating need for more acute intervention.  Patient verbalized understanding and all questions were answered.  Patient received an After-Visit Summary.    Orders Placed This Encounter  Procedures  . POCT Influenza A/B     Note is dictated utilizing voice recognition software. Although note has been proof read prior to signing, occasional typographical errors still can be missed. If any questions arise, please Berry not hesitate to call for verification.   electronically signed by:  Nancy Berry  [facility] - [place]

## 2018-06-23 NOTE — Patient Instructions (Addendum)
Rest, hydrate.  + flonase, mucinex DM if cough tamiflu  prescribed, take until completed.  z-pack if symptoms worsen or fever remains after 2nd-3rd day of tamiflu.  If cough present it can last up to 6-8 weeks.  F/U 2 weeks of not improved.  Influenza, Adult Influenza is also called "the flu." It is an infection in the lungs, nose, and throat (respiratory tract). It is caused by a virus. The flu causes symptoms that are similar to symptoms of a cold. It also causes a high fever and body aches. The flu spreads easily from person to person (is contagious). Getting a flu shot (influenza vaccination) every year is the best way to prevent the flu. What are the causes? This condition is caused by the influenza virus. You can get the virus by:  Breathing in droplets that are in the air from the cough or sneeze of a person who has the virus.  Touching something that has the virus on it (is contaminated) and then touching your mouth, nose, or eyes. What increases the risk? Certain things may make you more likely to get the flu. These include:  Not washing your hands often.  Having close contact with many people during cold and flu season.  Touching your mouth, eyes, or nose without first washing your hands.  Not getting a flu shot every year. You may have a higher risk for the flu, along with serious problems such as a lung infection (pneumonia), if you:  Are older than 65.  Are pregnant.  Have a weakened disease-fighting system (immune system) because of a disease or taking certain medicines.  Have a long-term (chronic) illness, such as: ? Heart, kidney, or lung disease. ? Diabetes. ? Asthma.  Have a liver disorder.  Are very overweight (morbidly obese).  Have anemia. This is a condition that affects your red blood cells. What are the signs or symptoms? Symptoms usually begin suddenly and last 4-14 days. They may include:  Fever and chills.  Headaches, body aches, or muscle  aches.  Sore throat.  Cough.  Runny or stuffy (congested) nose.  Chest discomfort.  Not wanting to eat as much as normal (poor appetite).  Weakness or feeling tired (fatigue).  Dizziness.  Feeling sick to your stomach (nauseous) or throwing up (vomiting). How is this treated? If the flu is found early, you can be treated with medicine that can help reduce how bad the illness is and how long it lasts (antiviral medicine). This may be given by mouth (orally) or through an IV tube. Taking care of yourself at home can help your symptoms get better. Your doctor may suggest:  Taking over-the-counter medicines.  Drinking plenty of fluids. The flu often goes away on its own. If you have very bad symptoms or other problems, you may be treated in a hospital. Follow these instructions at home:     Activity  Rest as needed. Get plenty of sleep.  Stay home from work or school as told by your doctor. ? Do not leave home until you do not have a fever for 24 hours without taking medicine. ? Leave home only to visit your doctor. Eating and drinking  Take an ORS (oral rehydration solution). This is a drink that is sold at pharmacies and stores.  Drink enough fluid to keep your pee (urine) pale yellow.  Drink clear fluids in small amounts as you are able. Clear fluids include: ? Water. ? Ice chips. ? Fruit juice that has water added (  diluted fruit juice). ? Low-calorie sports drinks.  Eat bland, easy-to-digest foods in small amounts as you are able. These foods include: ? Bananas. ? Applesauce. ? Rice. ? Lean meats. ? Toast. ? Crackers.  Do not eat or drink: ? Fluids that have a lot of sugar or caffeine. ? Alcohol. ? Spicy or fatty foods. General instructions  Take over-the-counter and prescription medicines only as told by your doctor.  Use a cool mist humidifier to add moisture to the air in your home. This can make it easier for you to breathe.  Cover your mouth and  nose when you cough or sneeze.  Wash your hands with soap and water often, especially after you cough or sneeze. If you cannot use soap and water, use alcohol-based hand sanitizer.  Keep all follow-up visits as told by your doctor. This is important. How is this prevented?   Get a flu shot every year. You may get the flu shot in late summer, fall, or winter. Ask your doctor when you should get your flu shot.  Avoid contact with people who are sick during fall and winter (cold and flu season). Contact a doctor if:  You get new symptoms.  You have: ? Chest pain. ? Watery poop (diarrhea). ? A fever.  Your cough gets worse.  You start to have more mucus.  You feel sick to your stomach.  You throw up. Get help right away if you:  Have shortness of breath.  Have trouble breathing.  Have skin or nails that turn a bluish color.  Have very bad pain or stiffness in your neck.  Get a sudden headache.  Get sudden pain in your face or ear.  Cannot eat or drink without throwing up. Summary  Influenza ("the flu") is an infection in the lungs, nose, and throat. It is caused by a virus.  Take over-the-counter and prescription medicines only as told by your doctor.  Getting a flu shot every year is the best way to avoid getting the flu. This information is not intended to replace advice given to you by your health care provider. Make sure you discuss any questions you have with your health care provider. Document Released: 02/24/2008 Document Revised: 11/02/2017 Document Reviewed: 11/02/2017 Elsevier Interactive Patient Education  2019 [employer].

## 2019-04-20 ENCOUNTER — Ambulatory Visit (INDEPENDENT_AMBULATORY_CARE_PROVIDER_SITE_OTHER): Payer: BLUE CROSS/BLUE SHIELD | Admitting: Family Medicine

## 2019-04-20 ENCOUNTER — Encounter: Payer: Self-pay | Admitting: Family Medicine

## 2019-04-20 ENCOUNTER — Other Ambulatory Visit: Payer: Self-pay

## 2019-04-20 DIAGNOSIS — R05 Cough: Secondary | ICD-10-CM | POA: Diagnosis not present

## 2019-04-20 DIAGNOSIS — B349 Viral infection, unspecified: Secondary | ICD-10-CM

## 2019-04-20 DIAGNOSIS — R059 Cough, unspecified: Secondary | ICD-10-CM

## 2019-04-20 DIAGNOSIS — Z7189 Other specified counseling: Secondary | ICD-10-CM

## 2019-04-20 MED ORDER — HYDROCODONE-HOMATROPINE 5-1.5 MG/5ML PO SYRP: 5.0000 mL | ORAL_SOLUTION | Freq: Three times a day (TID) | ORAL | 0 refills | Status: DC | PRN

## 2019-04-20 MED ORDER — BENZONATATE 200 MG PO CAPS: 200.0000 mg | ORAL_CAPSULE | Freq: Two times a day (BID) | ORAL | 0 refills | Status: DC | PRN

## 2019-04-20 NOTE — Patient Instructions (Signed)

## 2019-04-20 NOTE — Progress Notes (Signed)
VIRTUAL VISIT VIA VIDEO  I connected with Nancy Berry on 04/20/19 at  8:00 AM EST by a video enabled telemedicine application and verified that I am speaking with the correct person using two identifiers. Location patient: Home Location provider: [facility], Office Persons participating in the virtual visit: Patient, Dr. Claiborne Billings and R.Baker, LPN  I discussed the limitations of evaluation and management by telemedicine and the availability of in person appointments. The patient expressed understanding and agreed to proceed.   SUBJECTIVE Chief Complaint  Patient presents with  . Nasal Congestion    no fever. no body aches. taking OTC. started monday.   . Cough    worse at night    HPI: Nancy Berry is a 53 y.o. female present for acute illness.  Patient reports she started to have cough and nasal congestion symptoms on Monday, November 16.  Her 66-month-old Asa Lente was ill a few days prior and she was exposed to her.  Her grandbaby did not go to the doctor, and recuperated from her symptoms.  Patient reports nasal congestion, postnasal drip, dry cough that is all day but worse at the night and keeping her up.  She is started Mucinex and antihistamine.  She denies headaches, fever, chills, nausea, vomit, diarrhea or shortness of breath.  ROS: See pertinent positives and negatives per HPI.  Patient Active Problem List   Diagnosis Date Noted  . Overweight (BMI 25.0-29.9) 03/14/2018  . Elevated LDL cholesterol level 03/14/2018  . Cervical spine pain 02/07/2017  . Vitamin D insufficiency 12/05/2015  . Encounter for chronic pain management 08/19/2015  . Chronic pain of right knee 07/29/2015    Social History   Tobacco Use  . Smoking status: Never Smoker  . Smokeless tobacco: Never Used  Substance Use Topics  . Alcohol use: No    Alcohol/week: 0.0 standard drinks    Current Outpatient Medications:  .  Cholecalciferol (VITAMIN D3) 1000 units CAPS, Take 1 capsule by  mouth daily., Disp: , Rfl:  .  traMADol (ULTRAM) 50 MG tablet, Take 1 tablet (50 mg total) by mouth every 12 (twelve) hours as needed for moderate pain., Disp: 60 tablet, Rfl: 5 .  azithromycin (ZITHROMAX) 250 MG tablet, 500 mg day 1, then 250 mg QD (Patient not taking: Reported on 04/20/2019), Disp: 6 tablet, Rfl: 0 .  HYDROcodone-homatropine (HYCODAN) 5-1.5 MG/5ML syrup, Take 5 mLs by mouth every 8 (eight) hours as needed for cough. (Patient not taking: Reported on 04/20/2019), Disp: 120 mL, Rfl: 0 .  oseltamivir (TAMIFLU) 75 MG capsule, Take 1 capsule (75 mg total) by mouth 2 (two) times daily. (Patient not taking: Reported on 04/20/2019), Disp: 10 capsule, Rfl: 0  Allergies  Allergen Reactions  . Baclofen Other (See Comments)    "loopy"     OBJECTIVE: LMP 07/02/2015  Gen: No acute distress. Nontoxic in appearance.  HENT: AT. [place].  MMM. Nasall congestion. Scratchy- hoarse voice.  Eyes:Pupils Equal Round Reactive to light, Extraocular movements intact,  Conjunctiva without redness, discharge or icterus. Chest: Cough present. No shortness of breath Skin: no rashes, purpura or petechiae.  Neuro:  Alert. Oriented x3    ASSESSMENT AND PLAN: Nancy Berry is a 53 y.o. female present for  Cough.viral illness/covid ed Rest, hydrate.  +/- flonase, mucinex (DM if cough), nettie pot or nasal saline.  Hycodan prescribed Quarantine/isolation protocol discussed.       - Quarantine until results received and if positive test >>>Quarantine for 14 days from  onset of symptoms (10/16- onset)  and symptoms free 72 hours if positive test  - Novel Coronavirus, NAA ([facility])  > 15 minutes spent with patient, > 50% of that time face to face   Howard Pouch, DO 04/20/2019

## 2019-09-06 ENCOUNTER — Telehealth: Payer: Self-pay

## 2019-09-06 ENCOUNTER — Other Ambulatory Visit: Payer: Self-pay

## 2019-09-06 ENCOUNTER — Encounter: Payer: Self-pay | Admitting: Family Medicine

## 2019-09-06 ENCOUNTER — Ambulatory Visit (INDEPENDENT_AMBULATORY_CARE_PROVIDER_SITE_OTHER): Payer: PRIVATE HEALTH INSURANCE | Admitting: Family Medicine

## 2019-09-06 VITALS — BP 124/80 | HR 73 | Temp 98.3°F | Ht 65.0 in | Wt 163.2 lb

## 2019-09-06 DIAGNOSIS — M25561 Pain in right knee: Secondary | ICD-10-CM

## 2019-09-06 DIAGNOSIS — E559 Vitamin D deficiency, unspecified: Secondary | ICD-10-CM | POA: Diagnosis not present

## 2019-09-06 DIAGNOSIS — G8929 Other chronic pain: Secondary | ICD-10-CM

## 2019-09-06 LAB — HM MAMMOGRAPHY

## 2019-09-06 MED ORDER — TRAMADOL HCL 50 MG PO TABS: 50.0000 mg | ORAL_TABLET | Freq: Two times a day (BID) | ORAL | 5 refills | Status: DC | PRN

## 2019-09-06 NOTE — Progress Notes (Signed)
This visit occurred during the SARS-CoV-2 public health emergency.  Safety protocols were in place, including screening questions prior to the visit, additional usage of staff PPE, and extensive cleaning of exam room while observing appropriate contact time as indicated for disinfecting solutions.    Nancy Berry , [birth year], 54 y.o., female MRN: [id number] Patient Care Team    Relationship Specialty Notifications Start End  Natalia Leatherwood, DO PCP - General Family Medicine  07/29/15   Lynden Ang, NP Nurse Practitioner Obstetrics and Gynecology  08/19/15     Chief Complaint  Patient presents with  . Right knee pain    requeting refills for Tramadol     Subjective: Nancy Berry is a 54 y.o. female present to discuss her chronic pain.  Encounter for chronic pain/right knee pain: pt presents for follow up on chronic pain of her knees.  She reports use of   tramadol 50 mg once a day every few days. She states sometimes she will need twice a day if she has a flare, which is occurs if she has been on her feet or walking more. She reports tramadol works well for her. NSAIDS are not helpful. Indication for chronic opioid: Knee pain Medication and dose: tramadol #60, 50 BID PRN # pills per month: #60 Last UDS date: collected today Pain contract signed (Y/N): Yes> updated today Date narcotic database last reviewed (include red flags): 09/06/19   Depression screen [facility] 2/9 09/06/2019 03/14/2018 03/06/2018 12/08/2016 08/19/2015  Decreased Interest 0 0 0 0 0  Down, Depressed, Hopeless 0 0 0 0 0  PHQ - 2 Score 0 0 0 0 0    Allergies  Allergen Reactions  . Baclofen Other (See Comments)    "loopy"    Social History   Social History Narrative   Married, husband Ron. 2 children Rosalyn Gess, [place])    AAS degree, Loss analyst    Drinks caffeine, uses herbal remedies.    Wears seatbelt, exercises >3x a week, smoke detector in the home   Firearms  In the home (locked cabinets)   Feels  safe in her relationships.    Past Medical History:  Diagnosis Date  . Acute torticollis 2017  . Anxiety   . Insomnia   . Onychomycosis   . Perimenopause    Past Surgical History:  Procedure Laterality Date  . CESAREAN SECTION     Family History  Problem Relation Age of Onset  . Breast cancer Mother 49  . Colon cancer Maternal Grandmother 75   Allergies as of 09/06/2019      Reactions   Baclofen Other (See Comments)   "loopy"       Medication List       Accurate as of September 06, 2019  9:44 AM. If you have any questions, ask your nurse or doctor.        STOP taking these medications   benzonatate 200 MG capsule Commonly known as: TESSALON Stopped by: Felix Pacini, DO   HYDROcodone-homatropine 5-1.5 MG/5ML syrup Commonly known as: HYCODAN Stopped by: Felix Pacini, DO   Vitamin D3 25 MCG (1000 UT) Caps Stopped by: Felix Pacini, DO     TAKE these medications   MULTIVITAMIN ADULT PO Take by mouth.   traMADol 50 MG tablet Commonly known as: ULTRAM Take 1 tablet (50 mg total) by mouth every 12 (twelve) hours as needed for moderate pain.       All past medical history, surgical history, allergies, family  history, immunizations andmedications were updated in the EMR today and reviewed under the history and medication portions of their EMR.     ROS: Negative, with the exception of above mentioned in HPI   Objective:  BP 124/80 (BP Location: Right Arm, Patient Position: Sitting, Cuff Size: Normal)   Pulse 73   Temp 98.3 F (36.8 C) (Temporal)   Ht 5\' 5"  (1.651 m)   Wt 163 lb 3.2 oz (74 kg)   LMP 07/02/2015   SpO2 98%   BMI 27.16 kg/m  Body mass index is 27.16 kg/m. Gen: Afebrile. No acute distress. Nontoxic in appearance, well developed, well nourished.  HENT: AT. [place].  Eyes:Pupils Equal Round Reactive to light, Extraocular movements intact,  Conjunctiva without redness, discharge or icterus. CV: RRR Chest: CTAB, no wheeze or crackles. Good air movement,  normal resp effort.  MSK: no erythema, no effusion or soft tissue swelling. Normal ROM. No TTP. NV intact distally.  Neuro: Normal gait. PERLA. EOMi. Alert. Oriented x3    No exam data present No results found. No results found for this or any previous visit (from the past 24 hour(s)).  Assessment/Plan: Nancy Berry is a 54 y.o. female present for OV for  Chronic pain of right knee Encounter for chronic pain management Cervical spine pain Stable. Using infrequently- same script > 1 yr.  Chronic pain.improved QOL  Indication for chronic opioid: Knee pain Medication and dose: tramadol #60, 50 mg BID PRN # pills per month: #60 Last UDS date: Collected today Pain contract signed (Y/N): Yes Date narcotic database last reviewed (include red flags): 09/06/19 - F/u q 6 months if requesting refills   Reviewed expectations re: course of current medical issues.  Discussed self-management of symptoms.  Outlined signs and symptoms indicating need for more acute intervention.  Patient verbalized understanding and all questions were answered.  Patient received an After-Visit Summary.    Orders Placed This Encounter  Procedures  . Pain Mgmt, Profile 8 w/Conf, U   Meds ordered this encounter  Medications  . traMADol (ULTRAM) 50 MG tablet    Sig: Take 1 tablet (50 mg total) by mouth every 12 (twelve) hours as needed for moderate pain.    Dispense:  60 tablet    Refill:  5   Referral Orders  No referral(s) requested today     Note is dictated utilizing voice recognition software. Although note has been proof read prior to signing, occasional typographical errors still can be missed. If any questions arise, please do not hesitate to call for verification.   electronically signed by:  Howard Pouch, DO  [place]

## 2019-09-06 NOTE — Telephone Encounter (Signed)
Faxed [facility] mammogram report placed on Dr Blenda Bridegroom desk to review. Abstracted into chart. Completed on 09/06/2019. Bi-RADS Cat 2: Benign.

## 2019-09-06 NOTE — Patient Instructions (Signed)
I have refilled your medication for you today.  Follow up every 6 months or when needing refills.

## 2019-09-07 LAB — PAIN MGMT, PROFILE 8 W/CONF, U
6 Acetylmorphine: NEGATIVE ng/mL
Alcohol Metabolites: NEGATIVE ng/mL (ref <500)
Amphetamines: NEGATIVE ng/mL
Benzodiazepines: NEGATIVE ng/mL
Buprenorphine, Urine: NEGATIVE ng/mL
Cocaine Metabolite: NEGATIVE ng/mL
Creatinine: 161.2 mg/dL
MDMA: NEGATIVE ng/mL
Marijuana Metabolite: NEGATIVE ng/mL
Opiates: NEGATIVE ng/mL
Oxidant: NEGATIVE ug/mL
Oxycodone: NEGATIVE ng/mL
pH: 6 (ref 4.5–9.0)

## 2019-09-10 NOTE — Telephone Encounter (Signed)
Please inform patient her mammogram is normal.  

## 2019-09-11 NOTE — Telephone Encounter (Signed)
Pt was called and detailed message with results were left on pts VM

## 2019-10-26 ENCOUNTER — Encounter: Payer: BLUE CROSS/BLUE SHIELD | Admitting: Family Medicine

## 2021-03-17 ENCOUNTER — Telehealth: Payer: Self-pay

## 2021-03-17 NOTE — Telephone Encounter (Signed)
LVM for pt to CB regarding med request.   Note: pt will need to keep appt if currently out of meds. If have enough until CPE rx can be filled at that time

## 2021-03-17 NOTE — Telephone Encounter (Signed)
Pt called to schedule appts but wanted to know if her medication refill can be done with CPE. Pt has not been seen since April 2021. She has not run out of medication but may not have enough to last until CPE appt.   traMADol (ULTRAM) 50 MG tablet  I scheduled her 2 different appts. If 03/27/21 appt is not needed, please let pt know.

## 2021-03-23 NOTE — Telephone Encounter (Signed)
Pt.notified

## 2021-03-27 ENCOUNTER — Ambulatory Visit: Payer: PRIVATE HEALTH INSURANCE | Admitting: Family Medicine

## 2021-03-27 DIAGNOSIS — G8929 Other chronic pain: Secondary | ICD-10-CM

## 2021-04-17 ENCOUNTER — Other Ambulatory Visit: Payer: Self-pay

## 2021-04-17 ENCOUNTER — Ambulatory Visit (INDEPENDENT_AMBULATORY_CARE_PROVIDER_SITE_OTHER): Payer: PRIVATE HEALTH INSURANCE | Admitting: Family Medicine

## 2021-04-17 ENCOUNTER — Encounter: Payer: Self-pay | Admitting: Family Medicine

## 2021-04-17 VITALS — BP 112/73 | HR 71 | Temp 98.1°F | Ht 65.0 in | Wt 165.0 lb

## 2021-04-17 DIAGNOSIS — M542 Cervicalgia: Secondary | ICD-10-CM | POA: Diagnosis not present

## 2021-04-17 DIAGNOSIS — G8929 Other chronic pain: Secondary | ICD-10-CM | POA: Diagnosis not present

## 2021-04-17 DIAGNOSIS — M25561 Pain in right knee: Secondary | ICD-10-CM

## 2021-04-17 MED ORDER — TRAMADOL HCL 50 MG PO TABS: 50.0000 mg | ORAL_TABLET | Freq: Two times a day (BID) | ORAL | 5 refills | Status: DC | PRN

## 2021-04-17 NOTE — Progress Notes (Signed)
This visit occurred during the SARS-CoV-2 public health emergency.  Safety protocols were in place, including screening questions prior to the visit, additional usage of staff PPE, and extensive cleaning of exam room while observing appropriate contact time as indicated for disinfecting solutions.    Nancy Berry , Jul 15, 1965, 55 y.o., female MRN: [id number] Patient Care Team    Relationship Specialty Notifications Start End  Natalia Leatherwood, DO PCP - General Family Medicine  07/29/15   Lynden Ang, NP Nurse Practitioner Obstetrics and Gynecology  08/19/15     Chief Complaint  Patient presents with   Pain Management    CMC; pt is not fasting     Subjective: Nancy Berry is a 55 y.o. female present to discuss her chronic pain.  Encounter for chronic pain/right knee pain: pt presents for follow up on chronic pain of her knees.  She reports use of   tramadol 50 mg once a day every few days- helps with QOL and keeps her active. She states sometimes she will need twice a day if she has a flare, which is occurs if she has been on her feet or walking more. She reports tramadol works well for her. NSAIDS are not helpful. Indication for chronic opioid: Knee pain Medication and dose: tramadol #60, 50 BID PRN # pills per month: #60 Last UDS date: collected  today Pain contract signed (Y/N): Yes Date narcotic database last reviewed (include red flags): 04/17/21  Depression screen [facility] 2/9 04/17/2021 09/06/2019 03/14/2018 03/06/2018 12/08/2016  Decreased Interest 0 0 0 0 0  Down, Depressed, Hopeless 0 0 0 0 0  PHQ - 2 Score 0 0 0 0 0   Allergies  Allergen Reactions   Baclofen Other (See Comments)    "loopy"    Social History   Social History Narrative   Married, husband Ron. 2 children Rosalyn Gess, [place])    AAS degree, Loss analyst    Drinks caffeine, uses herbal remedies.    Wears seatbelt, exercises >3x a week, smoke detector in the home   Firearms  In the home (locked cabinets)    Feels safe in her relationships.    Past Medical History:  Diagnosis Date   Acute torticollis 2017   Anxiety    Insomnia    Onychomycosis    Perimenopause    Past Surgical History:  Procedure Laterality Date   CESAREAN SECTION     Family History  Problem Relation Age of Onset   Breast cancer Mother 16   Colon cancer Maternal Grandmother 102   Allergies as of 04/17/2021       Reactions   Baclofen Other (See Comments)   "loopy"         Medication List        Accurate as of April 17, 2021  3:06 PM. If you have any questions, ask your nurse or doctor.          MULTIVITAMIN ADULT PO Take by mouth.   traMADol 50 MG tablet Commonly known as: ULTRAM Take 1 tablet (50 mg total) by mouth every 12 (twelve) hours as needed for moderate pain.        All past medical history, surgical history, allergies, family history, immunizations andmedications were updated in the EMR today and reviewed under the history and medication portions of their EMR.     ROS: Negative, with the exception of above mentioned in HPI   Objective:  BP 112/73   Pulse 71  Temp 98.1 F (36.7 C) (Oral)   Ht 5\' 5"  (1.651 m)   Wt 165 lb (74.8 kg)   LMP 07/02/2015   SpO2 98%   BMI 27.46 kg/m  Body mass index is 27.46 kg/m. Gen: Afebrile. No acute distress. Pleasant female.  HENT: AT. [place]. Eyes:Pupils Equal Round Reactive to light, Extraocular movements intact,  Conjunctiva without redness, discharge or icterus. Skin: no rashes, purpura or petechiae.  Neuro: Normal gait. PERLA. EOMi. Alert. Oriented x3 Psych: Normal affect, dress and demeanor. Normal speech. Normal thought content and judgment.  No results found. No results found. No results found for this or any previous visit (from the past 24 hour(s)).  Assessment/Plan: Nancy Berry is a 55 y.o. female present for OV for  Chronic pain of right knee Encounter for chronic pain management Cervical spine pain Stable.  Chronic  pain.improved  QOL Indication for chronic opioid: Knee pain Medication and dose: tramadol #60, 50 mg BID PRN # pills per month: #60 Last UDS date: collected today Pain contract signed (Y/N): Yes Date narcotic database last reviewed (include red flags): 04/17/21 - F/u q 6 months if requesting refills  Reviewed expectations re: course of current medical issues. Discussed self-management of symptoms. Outlined signs and symptoms indicating need for more acute intervention. Patient verbalized understanding and all questions were answered. Patient received an After-Visit Summary.    Orders Placed This Encounter  Procedures   Drug Monitoring Panel 04/19/21 , Urine    Meds ordered this encounter  Medications   traMADol (ULTRAM) 50 MG tablet    Sig: Take 1 tablet (50 mg total) by mouth every 12 (twelve) hours as needed for moderate pain.    Dispense:  60 tablet    Refill:  5    Referral Orders  No referral(s) requested today     Note is dictated utilizing voice recognition software. Although note has been proof read prior to signing, occasional typographical errors still can be missed. If any questions arise, please do not hesitate to call for verification.   electronically signed by:  [id number], DO  [facility] [place]

## 2021-04-17 NOTE — Patient Instructions (Addendum)
Great to see you today.  I have refilled the medication(s) we provide.   If labs were collected, we will inform you of lab results once received either by echart message or telephone call.   - echart message- for normal results that have been seen by the patient already.   - telephone call: abnormal results or if patient has not viewed results in their echart.  

## 2021-04-19 LAB — DRUG MONITORING PANEL 376104, URINE
Amphetamines: NEGATIVE ng/mL (ref <500)
Barbiturates: NEGATIVE ng/mL (ref <300)
Benzodiazepines: NEGATIVE ng/mL (ref <100)
Cocaine Metabolite: NEGATIVE ng/mL (ref <150)
Desmethyltramadol: 1140 ng/mL — ABNORMAL HIGH (ref <100)
Opiates: NEGATIVE ng/mL (ref <100)
Oxycodone: NEGATIVE ng/mL (ref <100)
Tramadol: 7528 ng/mL — ABNORMAL HIGH (ref <100)

## 2021-04-19 LAB — DM TEMPLATE

## 2021-05-28 ENCOUNTER — Other Ambulatory Visit: Payer: Self-pay

## 2021-05-29 ENCOUNTER — Ambulatory Visit (INDEPENDENT_AMBULATORY_CARE_PROVIDER_SITE_OTHER): Payer: PRIVATE HEALTH INSURANCE | Admitting: Family Medicine

## 2021-05-29 ENCOUNTER — Encounter: Payer: Self-pay | Admitting: Family Medicine

## 2021-05-29 VITALS — BP 117/75 | HR 75 | Temp 98.4°F | Ht 65.0 in | Wt 167.0 lb

## 2021-05-29 DIAGNOSIS — M542 Cervicalgia: Secondary | ICD-10-CM

## 2021-05-29 DIAGNOSIS — E7800 Pure hypercholesterolemia, unspecified: Secondary | ICD-10-CM

## 2021-05-29 DIAGNOSIS — Z131 Encounter for screening for diabetes mellitus: Secondary | ICD-10-CM | POA: Diagnosis not present

## 2021-05-29 DIAGNOSIS — G8929 Other chronic pain: Secondary | ICD-10-CM

## 2021-05-29 DIAGNOSIS — E559 Vitamin D deficiency, unspecified: Secondary | ICD-10-CM | POA: Diagnosis not present

## 2021-05-29 DIAGNOSIS — Z1211 Encounter for screening for malignant neoplasm of colon: Secondary | ICD-10-CM

## 2021-05-29 DIAGNOSIS — E663 Overweight: Secondary | ICD-10-CM

## 2021-05-29 DIAGNOSIS — M25561 Pain in right knee: Secondary | ICD-10-CM

## 2021-05-29 DIAGNOSIS — Z0000 Encounter for general adult medical examination without abnormal findings: Secondary | ICD-10-CM | POA: Diagnosis not present

## 2021-05-29 LAB — LIPID PANEL
Cholesterol: 274 mg/dL — ABNORMAL HIGH (ref 0–200)
HDL: 70.5 mg/dL (ref >39.00)
LDL Cholesterol: 183 mg/dL — ABNORMAL HIGH (ref 0–99)
NonHDL: 203.36
Total CHOL/HDL Ratio: 4
Triglycerides: 104 mg/dL (ref 0.0–149.0)
VLDL: 20.8 mg/dL (ref 0.0–40.0)

## 2021-05-29 LAB — TSH: TSH: 1.71 u[IU]/mL (ref 0.35–5.50)

## 2021-05-29 LAB — COMPREHENSIVE METABOLIC PANEL
ALT: 16 U/L (ref 0–35)
AST: 18 U/L (ref 0–37)
Albumin: 4.6 g/dL (ref 3.5–5.2)
Alkaline Phosphatase: 50 U/L (ref 39–117)
BUN: 13 mg/dL (ref 6–23)
CO2: 28 mEq/L (ref 19–32)
Calcium: 9.4 mg/dL (ref 8.4–10.5)
Chloride: 104 mEq/L (ref 96–112)
Creatinine, Ser: 1 mg/dL (ref 0.40–1.20)
GFR: 62.38 mL/min (ref >60.00)
Glucose, Bld: 76 mg/dL (ref 70–99)
Potassium: 4.4 mEq/L (ref 3.5–5.1)
Sodium: 142 mEq/L (ref 135–145)
Total Bilirubin: 0.8 mg/dL (ref 0.2–1.2)
Total Protein: 7.7 g/dL (ref 6.0–8.3)

## 2021-05-29 LAB — CBC
HCT: 48.7 % — ABNORMAL HIGH (ref 36.0–46.0)
Hemoglobin: 15.8 g/dL — ABNORMAL HIGH (ref 12.0–15.0)
MCHC: 32.5 g/dL (ref 30.0–36.0)
MCV: 93.9 fl (ref 78.0–100.0)
Platelets: 283 10*3/uL (ref 150.0–400.0)
RBC: 5.19 Mil/uL — ABNORMAL HIGH (ref 3.87–5.11)
RDW: 13.4 % (ref 11.5–15.5)
WBC: 5 10*3/uL (ref 4.0–10.5)

## 2021-05-29 LAB — VITAMIN D 25 HYDROXY (VIT D DEFICIENCY, FRACTURES): VITD: 32.08 ng/mL (ref 30.00–100.00)

## 2021-05-29 LAB — HEMOGLOBIN A1C: Hgb A1c MFr Bld: 5.8 % (ref 4.6–6.5)

## 2021-05-29 NOTE — Progress Notes (Signed)
This visit occurred during the SARS-CoV-2 public health emergency.  Safety protocols were in place, including screening questions prior to the visit, additional usage of staff PPE, and extensive cleaning of exam room while observing appropriate contact time as indicated for disinfecting solutions.    Patient ID: Nancy Berry, female  DOB: [birth year], 55 y.o.   MRN: [id number] Patient Care Team    Relationship Specialty Notifications Start End  Natalia Leatherwood, DO PCP - General Family Medicine  07/29/15   Lynden Ang, NP Nurse Practitioner Obstetrics and Gynecology  08/19/15     Chief Complaint  Patient presents with   Annual Exam    Pt is fasting    Subjective:  Nancy Berry is a 55 y.o.  Female  present for CPE/CMC. All past medical history, surgical history, allergies, family history, immunizations, medications and social history were updated in the electronic medical record today. All recent labs, ED visits and hospitalizations within the last year were reviewed.  Health maintenance:  Colonoscopy: cologuard 2018- negative, rpt in 3 years.  Mammogram: completed 2021 at GYN office. Has scheduled.  Cervical cancer screening: last pap: 09/02/2016 by: GYN Immunizations: tdap 02/08/2014, Influenza declined (encouraged yearly). Shingrix declined.  Infectious disease screening: HIV and Hep C 08/19/2015 DEXA: routine screen 60-65 Assistive device: none Oxygen JIR:CVEL Patient has a Dental home. Hospitalizations/ED visits: reviewed   Depression screen [facility] 2/9 05/29/2021 04/17/2021 09/06/2019 03/14/2018 03/06/2018  Decreased Interest 0 0 0 0 0  Down, Depressed, Hopeless 0 0 0 0 0  PHQ - 2 Score 0 0 0 0 0   No flowsheet data found.   Immunization History  Administered Date(s) Administered   Tdap 02/08/2014   Past Medical History:  Diagnosis Date   Acute torticollis 2017   Anxiety    Insomnia    Onychomycosis    Perimenopause    Allergies  Allergen Reactions   Baclofen  Other (See Comments)    "loopy"    Past Surgical History:  Procedure Laterality Date   CESAREAN SECTION     Family History  Problem Relation Age of Onset   Breast cancer Mother 43   Colon cancer Maternal Grandmother 59   Social History   Social History Narrative   Married, husband Ron. 2 children Rosalyn Gess, [place])    AAS degree, Loss analyst    Drinks caffeine, uses herbal remedies.    Wears seatbelt, exercises >3x a week, smoke detector in the home   Firearms  In the home (locked cabinets)   Feels safe in her relationships.     Allergies as of 05/29/2021       Reactions   Baclofen Other (See Comments)   "loopy"         Medication List        Accurate as of May 29, 2021 10:48 AM. If you have any questions, ask your nurse or doctor.          MULTIVITAMIN ADULT PO Take by mouth.   traMADol 50 MG tablet Commonly known as: ULTRAM Take 1 tablet (50 mg total) by mouth every 12 (twelve) hours as needed for moderate pain.        All past medical history, surgical history, allergies, family history, immunizations andmedications were updated in the EMR today and reviewed under the history and medication portions of their EMR.       ROS 14 pt review of systems performed and negative (unless mentioned in an HPI)  Objective: BP 117/75  Pulse 75    Temp 98.4 F (36.9 C) (Oral)    Ht 5\' 5"  (1.651 m)    Wt 167 lb (75.8 kg)    LMP 07/02/2015    SpO2 97%    BMI 27.79 kg/m   Physical Exam Vitals and nursing note reviewed.  Constitutional:      General: She is not in acute distress.    Appearance: Normal appearance. She is not ill-appearing or toxic-appearing.  HENT:     Head: Normocephalic and atraumatic.     Right Ear: Tympanic membrane, ear canal and external ear normal. There is no impacted cerumen.     Left Ear: Tympanic membrane, ear canal and external ear normal. There is no impacted cerumen.     Nose: No congestion or rhinorrhea.     Mouth/Throat:      Mouth: Mucous membranes are moist.     Pharynx: Oropharynx is clear. No oropharyngeal exudate or posterior oropharyngeal erythema.  Eyes:     General: No scleral icterus.       Right eye: No discharge.        Left eye: No discharge.     Extraocular Movements: Extraocular movements intact.     Conjunctiva/sclera: Conjunctivae normal.     Pupils: Pupils are equal, round, and reactive to light.  Cardiovascular:     Rate and Rhythm: Normal rate and regular rhythm.     Pulses: Normal pulses.     Heart sounds: Normal heart sounds. No murmur heard.   No friction rub. No gallop.  Pulmonary:     Effort: Pulmonary effort is normal. No respiratory distress.     Breath sounds: Normal breath sounds. No stridor. No wheezing, rhonchi or rales.  Chest:     Chest wall: No tenderness.  Abdominal:     General: Abdomen is flat. Bowel sounds are normal. There is no distension.     Palpations: Abdomen is soft. There is no mass.     Tenderness: There is no abdominal tenderness. There is no right CVA tenderness, left CVA tenderness, guarding or rebound.     Hernia: No hernia is present.  Musculoskeletal:        General: No swelling, tenderness or deformity. Normal range of motion.     Cervical back: Normal range of motion and neck supple. No rigidity or tenderness.     Right lower leg: No edema.     Left lower leg: No edema.  Lymphadenopathy:     Cervical: No cervical adenopathy.  Skin:    General: Skin is warm and dry.     Coloration: Skin is not jaundiced or pale.     Findings: No bruising, erythema, lesion or rash.  Neurological:     General: No focal deficit present.     Mental Status: She is alert and oriented to person, place, and time. Mental status is at baseline.     Cranial Nerves: No cranial nerve deficit.     Sensory: No sensory deficit.     Motor: No weakness.     Coordination: Coordination normal.     Gait: Gait normal.     Deep Tendon Reflexes: Reflexes normal.  Psychiatric:         Mood and Affect: Mood normal.        Behavior: Behavior normal.        Thought Content: Thought content normal.        Judgment: Judgment normal.     No results found.  Assessment/plan: 08/30/2015  L Nilsen is a 55 y.o. female present for CPE Elevated LDL cholesterol level/Overweight (BMI 25.0-29.9) Diet controlled - CBC - Comprehensive metabolic panel - Lipid panel - TSH  Vitamin D insufficiency - VITAMIN D 25 Hydroxy (Vit-D Deficiency, Fractures) Diabetes mellitus screening - Hemoglobin A1c  Colon cancer screening - Cologuard  Routine general medical examination at a health care facility Colonoscopy: cologuard - negative, rpt in 3 years.  Mammogram: completed 12/23/2016 at GYN office. Has scheduled.  Cervical cancer screening: last pap: 09/02/2016 by: GYN Immunizations: tdap 02/08/2014, Influenza declined (encouraged yearly). Shingrix declined.  Infectious disease screening: HIV and Hep C 08/19/2015 DEXA: N/A Patient was encouraged to exercise greater than 150 minutes a week. Patient was encouraged to choose a diet filled with fresh fruits and vegetables, and lean meats. AVS provided to patient today for education/recommendation on gender specific health and safety maintenance.  Return in about 1 year (around 05/30/2022) for CPE (30 min).  Orders Placed This Encounter  Procedures   CBC   Comprehensive metabolic panel   Hemoglobin A1c   Lipid panel   TSH   VITAMIN D 25 Hydroxy (Vit-D Deficiency, Fractures)   Cologuard   No orders of the defined types were placed in this encounter.  Referral Orders  No referral(s) requested today     Electronically signed by: Felix Pacini, DO [facility] [place]

## 2021-05-29 NOTE — Patient Instructions (Signed)
°Great to see you today.  °I have refilled the medication(s) we provide.  ° °If labs were collected, we will inform you of lab results once received either by echart message or telephone call.  ° - echart message- for normal results that have been seen by the patient already.  ° - telephone call: abnormal results or if patient has not viewed results in their echart. ° °Health Maintenance, Female °Adopting a healthy lifestyle and getting preventive care are important in promoting health and wellness. Ask your health care provider about: °The right schedule for you to have regular tests and exams. °Things you can do on your own to prevent diseases and keep yourself healthy. °What should I know about diet, weight, and exercise? °Eat a healthy diet ° °Eat a diet that includes plenty of vegetables, fruits, low-fat dairy products, and lean protein. °Do not eat a lot of foods that are high in solid fats, added sugars, or sodium. °Maintain a healthy weight °Body mass index (BMI) is used to identify weight problems. It estimates body fat based on height and weight. Your health care provider can help determine your BMI and help you achieve or maintain a healthy weight. °Get regular exercise °Get regular exercise. This is one of the most important things you can do for your health. Most adults should: °Exercise for at least 150 minutes each week. The exercise should increase your heart rate and make you sweat (moderate-intensity exercise). °Do strengthening exercises at least twice a week. This is in addition to the moderate-intensity exercise. °Spend less time sitting. Even light physical activity can be beneficial. °Watch cholesterol and blood lipids °Have your blood tested for lipids and cholesterol at 57 years of age, then have this test every 5 years. °Have your cholesterol levels checked more often if: °Your lipid or cholesterol levels are high. °You are older than 57 years of age. °You are at high risk for heart  disease. °What should I know about cancer screening? °Depending on your health history and family history, you may need to have cancer screening at various ages. This may include screening for: °Breast cancer. °Cervical cancer. °Colorectal cancer. °Skin cancer. °Lung cancer. °What should I know about heart disease, diabetes, and high blood pressure? °Blood pressure and heart disease °High blood pressure causes heart disease and increases the risk of stroke. This is more likely to develop in people who have high blood pressure readings or are overweight. °Have your blood pressure checked: °Every 3-5 years if you are 18-39 years of age. °Every year if you are 40 years old or older. °Diabetes °Have regular diabetes screenings. This checks your fasting blood sugar level. Have the screening done: °Once every three years after age 40 if you are at a normal weight and have a low risk for diabetes. °More often and at a younger age if you are overweight or have a high risk for diabetes. °What should I know about preventing infection? °Hepatitis B °If you have a higher risk for hepatitis B, you should be screened for this virus. Talk with your health care provider to find out if you are at risk for hepatitis B infection. °Hepatitis C °Testing is recommended for: °Everyone born from 1945 through 1965. °Anyone with known risk factors for hepatitis C. °Sexually transmitted infections (STIs) °Get screened for STIs, including gonorrhea and chlamydia, if: °You are sexually active and are younger than 57 years of age. °You are older than 57 years of age and your health care provider   tells you that you are at risk for this type of infection. °Your sexual activity has changed since you were last screened, and you are at increased risk for chlamydia or gonorrhea. Ask your health care provider if you are at risk. °Ask your health care provider about whether you are at high risk for HIV. Your health care provider may recommend a  prescription medicine to help prevent HIV infection. If you choose to take medicine to prevent HIV, you should first get tested for HIV. You should then be tested every 3 months for as long as you are taking the medicine. °Pregnancy °If you are about to stop having your period (premenopausal) and you may become pregnant, seek counseling before you get pregnant. °Take 400 to 800 micrograms (mcg) of folic acid every day if you become pregnant. °Ask for birth control (contraception) if you want to prevent pregnancy. °Osteoporosis and menopause °Osteoporosis is a disease in which the bones lose minerals and strength with aging. This can result in bone fractures. If you are 65 years old or older, or if you are at risk for osteoporosis and fractures, ask your health care provider if you should: °Be screened for bone loss. °Take a calcium or vitamin D supplement to lower your risk of fractures. °Be given hormone replacement therapy (HRT) to treat symptoms of menopause. °Follow these instructions at home: °Alcohol use °Do not drink alcohol if: °Your health care provider tells you not to drink. °You are pregnant, may be pregnant, or are planning to become pregnant. °If you drink alcohol: °Limit how much you have to: °0-1 drink a day. °Know how much alcohol is in your drink. In the [place] one drink equals one 12 oz bottle of beer (355 mL), one 5 oz glass of wine (148 mL), or one 1½ oz glass of hard liquor (44 mL). °Lifestyle °Do not use any products that contain nicotine or tobacco. These products include cigarettes, chewing tobacco, and vaping devices, such as e-cigarettes. If you need help quitting, ask your health care provider. °Do not use street drugs. °Do not share needles. °Ask your health care provider for help if you need support or information about quitting drugs. °General instructions °Schedule regular health, dental, and eye exams. °Stay current with your vaccines. °Tell your health care provider if: °You often  feel depressed. °You have ever been abused or do not feel safe at home. °Summary °Adopting a healthy lifestyle and getting preventive care are important in promoting health and wellness. °Follow your health care provider's instructions about healthy diet, exercising, and getting tested or screened for diseases. °Follow your health care provider's instructions on monitoring your cholesterol and blood pressure. °This information is not intended to replace advice given to you by your health care provider. Make sure you discuss any questions you have with your health care provider. °Document Revised: 10/06/2020 Document Reviewed: 10/06/2020 °Elsevier Patient Education © 2022 [employer] Inc. ° °

## 2021-06-18 ENCOUNTER — Telehealth: Payer: Self-pay | Admitting: Family Medicine

## 2021-06-18 NOTE — Telephone Encounter (Signed)
Pt called said she having sinus drainage & a cough. I offered to schedule pt for a VV.  Pt declined, she wanted to know can Dr Raliegh Ip send in a cough syrup for her cough. Told pt I could send a note back, more than likely she would need to be seen.--KR

## 2021-06-19 ENCOUNTER — Other Ambulatory Visit: Payer: Self-pay

## 2021-06-19 ENCOUNTER — Telehealth (INDEPENDENT_AMBULATORY_CARE_PROVIDER_SITE_OTHER): Payer: PRIVATE HEALTH INSURANCE | Admitting: Family Medicine

## 2021-06-19 ENCOUNTER — Encounter: Payer: Self-pay | Admitting: Family Medicine

## 2021-06-19 DIAGNOSIS — B9689 Other specified bacterial agents as the cause of diseases classified elsewhere: Secondary | ICD-10-CM

## 2021-06-19 DIAGNOSIS — J329 Chronic sinusitis, unspecified: Secondary | ICD-10-CM

## 2021-06-19 DIAGNOSIS — R051 Acute cough: Secondary | ICD-10-CM | POA: Diagnosis not present

## 2021-06-19 MED ORDER — AMOXICILLIN-POT CLAVULANATE 875-125 MG PO TABS: 1.0000 | ORAL_TABLET | Freq: Two times a day (BID) | ORAL | 0 refills | Status: DC

## 2021-06-19 MED ORDER — HYDROCODONE BIT-HOMATROP MBR 5-1.5 MG/5ML PO SOLN: 5.0000 mL | Freq: Three times a day (TID) | ORAL | 0 refills | Status: DC | PRN

## 2021-06-19 NOTE — Progress Notes (Signed)
VIRTUAL VISIT VIA VIDEO  I connected with Nancy Berry on 06/19/21 at  2:45 PM EST by a video enabled telemedicine application and verified that I am speaking with the correct person using two identifiers. Location patient: Home Location provider: [facility], Office Persons participating in the virtual visit: Patient, Dr. Claiborne Billings and V.White CMA  I discussed the limitations of evaluation and management by telemedicine and the availability of in person appointments. The patient expressed understanding and agreed to proceed.     Nancy Berry , 12/07/1965, 56 y.o., female MRN: [id number] Patient Care Team    Relationship Specialty Notifications Start End  Natalia Leatherwood, DO PCP - General Family Medicine  07/29/15   Lynden Ang, NP Nurse Practitioner Obstetrics and Gynecology  08/19/15     Chief Complaint  Patient presents with   Cough   Sinusitis     Subjective: Pt presents for an OV with complaints of 3 weeks of respiratory symptoms.  She reports cough as progressed over the last 1.5 weeks and is now a dry cough that is worse at night.  She has tried Mucinex DM and is helpful but is still not taking away the cough.  She has some mild sinus discomfort present and fatigue.  Depression screen [facility] 2/9 05/29/2021 04/17/2021 09/06/2019 03/14/2018 03/06/2018  Decreased Interest 0 0 0 0 0  Down, Depressed, Hopeless 0 0 0 0 0  PHQ - 2 Score 0 0 0 0 0    Allergies  Allergen Reactions   Baclofen Other (See Comments)    "loopy"    Social History   Social History Narrative   Married, husband Ron. 2 children Rosalyn Gess, [place])    AAS degree, Loss analyst    Drinks caffeine, uses herbal remedies.    Wears seatbelt, exercises >3x a week, smoke detector in the home   Firearms  In the home (locked cabinets)   Feels safe in her relationships.    Past Medical History:  Diagnosis Date   Acute torticollis 2017   Anxiety    Insomnia    Onychomycosis    Perimenopause     Past Surgical History:  Procedure Laterality Date   CESAREAN SECTION     Family History  Problem Relation Age of Onset   Breast cancer Mother 22   Colon cancer Maternal Grandmother 69   Allergies as of 06/19/2021       Reactions   Baclofen Other (See Comments)   "loopy"         Medication List        Accurate as of June 19, 2021  4:17 PM. If you have any questions, ask your nurse or doctor.          amoxicillin-clavulanate 875-125 MG tablet Commonly known as: AUGMENTIN Take 1 tablet by mouth 2 (two) times daily. Started by: Felix Pacini, DO   HYDROcodone bit-homatropine 5-1.5 MG/5ML syrup Commonly known as: HYCODAN Take 5 mLs by mouth every 8 (eight) hours as needed for cough. Started by: Felix Pacini, DO   MULTIVITAMIN ADULT PO Take by mouth.   traMADol 50 MG tablet Commonly known as: ULTRAM Take 1 tablet (50 mg total) by mouth every 12 (twelve) hours as needed for moderate pain.        All past medical history, surgical history, allergies, family history, immunizations andmedications were updated in the EMR today and reviewed under the history and medication portions of their EMR.  Review of Systems  Constitutional:  Positive for malaise/fatigue. Negative for chills and fever.  HENT:  Positive for congestion and sinus pain. Negative for ear discharge, ear pain and sore throat.   Eyes:  Negative for pain, discharge and redness.  Respiratory:  Positive for cough. Negative for sputum production, shortness of breath and wheezing.   Gastrointestinal:  Negative for abdominal pain, diarrhea, nausea and vomiting.  Skin:  Negative for rash.  Negative, with the exception of above mentioned in HPI   Objective:  LMP 07/02/2015  There is no height or weight on file to calculate BMI.  Physical Exam Vitals and nursing note reviewed.  Constitutional:      General: She is not in acute distress.    Appearance: Normal appearance. She is normal weight. She  is not ill-appearing or toxic-appearing.  HENT:     Mouth/Throat:     Mouth: Mucous membranes are dry.  Eyes:     General:        Right eye: No discharge.        Left eye: No discharge.     Extraocular Movements: Extraocular movements intact.     Conjunctiva/sclera: Conjunctivae normal.     Pupils: Pupils are equal, round, and reactive to light.  Pulmonary:     Effort: Pulmonary effort is normal.  Skin:    Findings: No rash.  Neurological:     Mental Status: She is alert and oriented to person, place, and time. Mental status is at baseline.  Psychiatric:        Mood and Affect: Mood normal.        Behavior: Behavior normal.        Thought Content: Thought content normal.        Judgment: Judgment normal.     No results found. No results found. No results found for this or any previous visit (from the past 24 hour(s)).  Assessment/Plan: Nancy Berry is a 56 y.o. female present for OV for  Bacterial sinusitis/Acute cough Suspect viral infection has moved into bacterial sinus infection with patient's HPI and timeline. Rest, hydrate.  Continue mucinex (DM if cough), nettie pot or nasal saline.  Augmentin twice daily prescribed, take until completed.  Hycodan cough syrup Tea/honey and throat lozenges recommended. If cough present it can last up to 6-8 weeks.  F/U 2 weeks of not improved.    Reviewed expectations re: course of current medical issues. Discussed self-management of symptoms. Outlined signs and symptoms indicating need for more acute intervention. Patient verbalized understanding and all questions were answered. Patient received an After-Visit Summary.    No orders of the defined types were placed in this encounter.  Meds ordered this encounter  Medications   amoxicillin-clavulanate (AUGMENTIN) 875-125 MG tablet    Sig: Take 1 tablet by mouth 2 (two) times daily.    Dispense:  20 tablet    Refill:  0   HYDROcodone bit-homatropine (HYCODAN) 5-1.5  MG/5ML syrup    Sig: Take 5 mLs by mouth every 8 (eight) hours as needed for cough.    Dispense:  120 mL    Refill:  0   Referral Orders  No referral(s) requested today     Note is dictated utilizing voice recognition software. Although note has been proof read prior to signing, occasional typographical errors still can be missed. If any questions arise, please do not hesitate to call for verification.   electronically signed by:  Felix Pacinienee Rebekkah Powless, DO  [facility] -  [place]

## 2021-06-19 NOTE — Patient Instructions (Signed)

## 2021-06-19 NOTE — Telephone Encounter (Signed)
Pt scheduled VV

## 2021-06-25 LAB — COLOGUARD: COLOGUARD: NEGATIVE

## 2021-12-04 ENCOUNTER — Ambulatory Visit: Payer: PRIVATE HEALTH INSURANCE | Admitting: Family Medicine

## 2022-02-26 ENCOUNTER — Ambulatory Visit: Payer: PRIVATE HEALTH INSURANCE | Admitting: Family Medicine

## 2022-02-26 ENCOUNTER — Other Ambulatory Visit: Payer: PRIVATE HEALTH INSURANCE

## 2022-09-06 DIAGNOSIS — Z1231 Encounter for screening mammogram for malignant neoplasm of breast: Secondary | ICD-10-CM | POA: Diagnosis not present

## 2022-09-06 LAB — HM MAMMOGRAPHY

## 2022-09-07 ENCOUNTER — Telehealth: Payer: Self-pay

## 2022-09-07 NOTE — Telephone Encounter (Signed)
Received Mammogram results from [place] on 09/06/22.

## 2022-09-07 NOTE — Telephone Encounter (Signed)
Please inform patient her mammogram is normal.  

## 2022-09-13 ENCOUNTER — Encounter: Payer: Self-pay | Admitting: Family Medicine

## 2022-09-13 ENCOUNTER — Ambulatory Visit (INDEPENDENT_AMBULATORY_CARE_PROVIDER_SITE_OTHER): Payer: 59 | Admitting: Family Medicine

## 2022-09-13 VITALS — BP 113/77 | HR 89 | Ht 65.0 in | Wt 167.7 lb

## 2022-09-13 DIAGNOSIS — Z0000 Encounter for general adult medical examination without abnormal findings: Secondary | ICD-10-CM

## 2022-09-13 DIAGNOSIS — Z1231 Encounter for screening mammogram for malignant neoplasm of breast: Secondary | ICD-10-CM

## 2022-09-13 DIAGNOSIS — Z23 Encounter for immunization: Secondary | ICD-10-CM

## 2022-09-13 DIAGNOSIS — M25561 Pain in right knee: Secondary | ICD-10-CM

## 2022-09-13 DIAGNOSIS — M542 Cervicalgia: Secondary | ICD-10-CM

## 2022-09-13 DIAGNOSIS — Z7689 Persons encountering health services in other specified circumstances: Secondary | ICD-10-CM

## 2022-09-13 DIAGNOSIS — G8929 Other chronic pain: Secondary | ICD-10-CM

## 2022-09-13 DIAGNOSIS — E7800 Pure hypercholesterolemia, unspecified: Secondary | ICD-10-CM

## 2022-09-13 DIAGNOSIS — Z131 Encounter for screening for diabetes mellitus: Secondary | ICD-10-CM | POA: Diagnosis not present

## 2022-09-13 MED ORDER — TRAMADOL HCL 50 MG PO TABS
50.0000 mg | ORAL_TABLET | Freq: Two times a day (BID) | ORAL | 5 refills | Status: DC | PRN
Start: 2022-09-13 — End: 2024-01-16

## 2022-09-13 NOTE — Progress Notes (Signed)
.    Patient ID: Nancy Berry, female  DOB: 1966/04/05, 57 y.o.   MRN: [id number] Patient Care Team    Relationship Specialty Notifications Start End  Natalia Leatherwood, DO PCP - General Family Medicine  07/29/15   Obgyn, Ma Hillock    09/13/22 09/13/22    Chief Complaint  Patient presents with   Annual Exam    Fasting CPE- Declined Shingles vaccine. Needs a new GYN.    Subjective: ERICIA Berry is a 57 y.o.  Female  present for CPE All past medical history, surgical history, allergies, family history, immunizations, medications and social history were updated in the electronic medical record today. All recent labs, ED visits and hospitalizations within the last year were reviewed.  Health maintenance:  Colonoscopy: Cologuard 06/18/2021.  Repeat in 3 years  Mammogram: completed 09/06/2022.  [facility] [address] Cervical cancer screening: last pap: 09/02/2016 by: GYN> referred to new GYN today Immunizations: tdap 02/08/2014, Influenza (encouraged yearly). Shingrix declined Infectious disease screening: HIV and Hep C 08/19/2015 DEXA: routine screen 60-65 Assistive device: none Oxygen MVH:QION Patient has a Dental home. Hospitalizations/ED visits: reviewed   Encounter for chronic backpain/right knee pain: pt presents for follow up on chronic pain of her knees.  She reports use of   tramadol 50 mg once a day every few days- helps with QOL and she is able to remain active. She states sometimes she will need twice a day if she has a flare, which is occurs if she has been on her feet or walking more. She reports tramadol works well for her. NSAIDS are not helpful. Indication for chronic opioid: Knee pain Medication and dose: tramadol #60, 50 BID PRN # pills per month: #60 Pain contract signed (Y/N): Yes Date narcotic database last reviewed (include red flags): 09/13/22      09/13/2022    1:40 PM  GAD 7 : Generalized Anxiety Score  Nervous, Anxious, on Edge 0  Control/stop worrying 0   Worry too much - different things 0  Trouble relaxing 0  Restless 0  Easily annoyed or irritable 0  Afraid - awful might happen 0  Total GAD 7 Score 0  Anxiety Difficulty Not difficult at all     Immunization History  Administered Date(s) Administered   Tdap 02/08/2014   Past Medical History:  Diagnosis Date   Acute torticollis 2017   Anxiety    Insomnia    Onychomycosis    Perimenopause    Allergies  Allergen Reactions   Baclofen Other (See Comments)    "loopy"    Past Surgical History:  Procedure Laterality Date   CESAREAN SECTION     Family History  Problem Relation Age of Onset   Breast cancer Mother 20   Colon cancer Maternal Grandmother 33   Social History   Social History Narrative   Married, husband Ron. 2 children Nancy Berry, [place])    AAS degree, Loss analyst    Drinks caffeine, uses herbal remedies.    Wears seatbelt, exercises >3x a week, smoke detector in the home   Firearms  In the home (locked cabinets)   Feels safe in her relationships.     Allergies as of 09/13/2022       Reactions   Baclofen Other (See Comments)   "loopy"         Medication List        Accurate as of September 13, 2022  2:14 PM. If you have any questions, ask your  nurse or doctor.          STOP taking these medications    amoxicillin-clavulanate 875-125 MG tablet Commonly known as: AUGMENTIN Stopped by: Felix Pacini, DO   HYDROcodone bit-homatropine 5-1.5 MG/5ML syrup Commonly known as: HYCODAN Stopped by: Felix Pacini, DO       TAKE these medications    MULTIVITAMIN ADULT PO Take by mouth.   traMADol 50 MG tablet Commonly known as: ULTRAM Take by mouth every 6 (six) hours as needed.   traMADol 50 MG tablet Commonly known as: ULTRAM Take 1 tablet (50 mg total) by mouth every 12 (twelve) hours as needed for moderate pain.        All past medical history, surgical history, allergies, family history, immunizations andmedications were updated in  the EMR today and reviewed under the history and medication portions of their EMR.     ROS 14 pt review of systems performed and negative (unless mentioned in an HPI)  Objective: BP 113/77   Pulse 89   Ht 5\' 5"  (1.651 m)   Wt 167 lb 11.2 oz (76.1 kg)   LMP 07/02/2015   SpO2 96%   BMI 27.91 kg/m  Physical Exam Vitals and nursing note reviewed.  Constitutional:      General: She is not in acute distress.    Appearance: Normal appearance. She is not ill-appearing or toxic-appearing.  HENT:     Head: Normocephalic and atraumatic.     Right Ear: Tympanic membrane, ear canal and external ear normal. There is no impacted cerumen.     Left Ear: Tympanic membrane, ear canal and external ear normal. There is no impacted cerumen.     Nose: No congestion or rhinorrhea.     Mouth/Throat:     Mouth: Mucous membranes are moist.     Pharynx: Oropharynx is clear. No oropharyngeal exudate or posterior oropharyngeal erythema.  Eyes:     General: No scleral icterus.       Right eye: No discharge.        Left eye: No discharge.     Extraocular Movements: Extraocular movements intact.     Conjunctiva/sclera: Conjunctivae normal.     Pupils: Pupils are equal, round, and reactive to light.  Cardiovascular:     Rate and Rhythm: Normal rate and regular rhythm.     Pulses: Normal pulses.     Heart sounds: Normal heart sounds. No murmur heard.    No friction rub. No gallop.  Pulmonary:     Effort: Pulmonary effort is normal. No respiratory distress.     Breath sounds: Normal breath sounds. No stridor. No wheezing, rhonchi or rales.  Chest:     Chest wall: No tenderness.  Abdominal:     General: Abdomen is flat. Bowel sounds are normal. There is no distension.     Palpations: Abdomen is soft. There is no mass.     Tenderness: There is no abdominal tenderness. There is no right CVA tenderness, left CVA tenderness, guarding or rebound.     Hernia: No hernia is present.  Musculoskeletal:         General: No swelling, tenderness or deformity. Normal range of motion.     Cervical back: Normal range of motion and neck supple. No rigidity or tenderness.     Right lower leg: No edema.     Left lower leg: No edema.  Lymphadenopathy:     Cervical: No cervical adenopathy.  Skin:    General: Skin is warm and  dry.     Coloration: Skin is not jaundiced or pale.     Findings: No bruising, erythema, lesion or rash.  Neurological:     General: No focal deficit present.     Mental Status: She is alert and oriented to person, place, and time. Mental status is at baseline.     Cranial Nerves: No cranial nerve deficit.     Sensory: No sensory deficit.     Motor: No weakness.     Coordination: Coordination normal.     Gait: Gait normal.     Deep Tendon Reflexes: Reflexes normal.  Psychiatric:        Mood and Affect: Mood normal.        Behavior: Behavior normal.        Thought Content: Thought content normal.        Judgment: Judgment normal.      No results found.  Assessment/plan: KATIE FARAONE is a 57 y.o. female present for CPE Elevated LDL cholesterol level/Overweight (BMI 25.0-29.9) Diet controlled lipid panel collected today  Chronic pain of right knee Encounter for chronic pain management Cervical spine pain stable Chronic pain.> improved QOL Continue tramdol BID PRN   Routine general medical examination at a health care facility Patient was encouraged to exercise greater than 150 minutes a week. Patient was encouraged to choose a diet filled with fresh fruits and vegetables, and lean meats. AVS provided to patient today for education/recommendation on gender specific health and safety maintenance. Colonoscopy: Cologuard 06/18/2021.  Repeat in 3 years  Mammogram: completed 09/06/2022.  [facility] [address] Cervical cancer screening: last pap: 09/02/2016 by: GYN> referred to new GYN today Immunizations: tdap 02/08/2014, Influenza (encouraged yearly). Shingrix  declined Infectious disease screening: HIV and Hep C 08/19/2015 DEXA: routine screen 60-65   Return in about 1 year (around 09/14/2023) for cpe (20 min).  Orders Placed This Encounter  Procedures   CBC with Differential/Platelet   Comprehensive metabolic panel   Hemoglobin A1c   Lipid panel   TSH   Ambulatory referral to Gynecology   Meds ordered this encounter  Medications   traMADol (ULTRAM) 50 MG tablet    Sig: Take 1 tablet (50 mg total) by mouth every 12 (twelve) hours as needed for moderate pain.    Dispense:  60 tablet    Refill:  5   Referral Orders         Ambulatory referral to Gynecology       Electronically signed by: Felix Pacini, DO [facility] [place]

## 2022-09-13 NOTE — Patient Instructions (Addendum)
Return in about 1 year (around 09/14/2023) for cpe (20 min).        Great to see you today.  I have refilled the medication(s) we provide.   If labs were collected, we will inform you of lab results once received either by echart message or telephone call.   - echart message- for normal results that have been seen by the patient already.   - telephone call: abnormal results or if patient has not viewed results in their echart.

## 2022-09-14 LAB — COMPREHENSIVE METABOLIC PANEL
AG Ratio: 1.4 (calc) (ref 1.0–2.5)
ALT: 13 U/L (ref 6–29)
AST: 15 U/L (ref 10–35)
Albumin: 4.2 g/dL (ref 3.6–5.1)
Alkaline phosphatase (APISO): 50 U/L (ref 37–153)
BUN/Creatinine Ratio: 14 (calc) (ref 6–22)
BUN: 16 mg/dL (ref 7–25)
CO2: 22 mmol/L (ref 20–32)
Calcium: 9.3 mg/dL (ref 8.6–10.4)
Chloride: 105 mmol/L (ref 98–110)
Creat: 1.11 mg/dL — ABNORMAL HIGH (ref 0.50–1.03)
Globulin: 3 g/dL (calc) (ref 1.9–3.7)
Glucose, Bld: 75 mg/dL (ref 65–99)
Potassium: 4.1 mmol/L (ref 3.5–5.3)
Sodium: 140 mmol/L (ref 135–146)
Total Bilirubin: 0.5 mg/dL (ref 0.2–1.2)
Total Protein: 7.2 g/dL (ref 6.1–8.1)

## 2022-09-14 LAB — CBC WITH DIFFERENTIAL/PLATELET
Absolute Monocytes: 624 cells/uL (ref 200–950)
Basophils Absolute: 38 cells/uL (ref 0–200)
Basophils Relative: 0.6 %
Eosinophils Absolute: 63 cells/uL (ref 15–500)
Eosinophils Relative: 1 %
HCT: 44.4 % (ref 35.0–45.0)
Hemoglobin: 15 g/dL (ref 11.7–15.5)
Lymphs Abs: 2003 cells/uL (ref 850–3900)
MCH: 30.3 pg (ref 27.0–33.0)
MCHC: 33.8 g/dL (ref 32.0–36.0)
MCV: 89.7 fL (ref 80.0–100.0)
MPV: 9.8 fL (ref 7.5–12.5)
Monocytes Relative: 9.9 %
Neutro Abs: 3572 cells/uL (ref 1500–7800)
Neutrophils Relative %: 56.7 %
Platelets: 297 10*3/uL (ref 140–400)
RBC: 4.95 10*6/uL (ref 3.80–5.10)
RDW: 12.9 % (ref 11.0–15.0)
Total Lymphocyte: 31.8 %
WBC: 6.3 10*3/uL (ref 3.8–10.8)

## 2022-09-14 LAB — LIPID PANEL
Cholesterol: 206 mg/dL — ABNORMAL HIGH (ref <200)
HDL: 59 mg/dL (ref >=50)
LDL Cholesterol (Calc): 130 mg/dL (calc) — ABNORMAL HIGH
Non-HDL Cholesterol (Calc): 147 mg/dL (calc) — ABNORMAL HIGH (ref <130)
Total CHOL/HDL Ratio: 3.5 (calc) (ref <5.0)
Triglycerides: 77 mg/dL (ref <150)

## 2022-09-14 LAB — HEMOGLOBIN A1C
Hgb A1c MFr Bld: 5.7 % of total Hgb — ABNORMAL HIGH (ref <5.7)
Mean Plasma Glucose: 117 mg/dL
eAG (mmol/L): 6.5 mmol/L

## 2022-09-14 LAB — TSH: TSH: 1.74 mIU/L (ref 0.40–4.50)

## 2022-10-07 ENCOUNTER — Telehealth: Payer: Self-pay

## 2022-10-07 NOTE — Telephone Encounter (Signed)
Patient Advocate Encounter  Prior Authorization for traMADol HCl 50MG  tablets has been approved through [employer].  Key: [id number]    Effective: 10-01-2022 to 03-30-2023

## 2022-10-14 DIAGNOSIS — Z7189 Other specified counseling: Secondary | ICD-10-CM | POA: Diagnosis not present

## 2022-10-14 DIAGNOSIS — D485 Neoplasm of uncertain behavior of skin: Secondary | ICD-10-CM | POA: Diagnosis not present

## 2022-10-14 DIAGNOSIS — L821 Other seborrheic keratosis: Secondary | ICD-10-CM | POA: Diagnosis not present

## 2022-10-14 DIAGNOSIS — D225 Melanocytic nevi of trunk: Secondary | ICD-10-CM | POA: Diagnosis not present

## 2022-10-14 DIAGNOSIS — L814 Other melanin hyperpigmentation: Secondary | ICD-10-CM | POA: Diagnosis not present

## 2022-10-14 DIAGNOSIS — L720 Epidermal cyst: Secondary | ICD-10-CM | POA: Diagnosis not present

## 2023-02-21 DIAGNOSIS — H16223 Keratoconjunctivitis sicca, not specified as Sjogren's, bilateral: Secondary | ICD-10-CM | POA: Diagnosis not present

## 2023-11-25 LAB — HM MAMMOGRAPHY

## 2023-12-01 ENCOUNTER — Telehealth: Payer: Self-pay | Admitting: Family Medicine

## 2023-12-01 NOTE — Telephone Encounter (Signed)
 Received mammogram result from [facility] Please call patient and inform her her mammogram is normal

## 2023-12-01 NOTE — Telephone Encounter (Signed)
 LVM to discuss. Sent MyChart.

## 2024-01-16 ENCOUNTER — Ambulatory Visit (INDEPENDENT_AMBULATORY_CARE_PROVIDER_SITE_OTHER): Admitting: Family Medicine

## 2024-01-16 VITALS — BP 114/78 | HR 77 | Temp 98.2°F | Ht 65.0 in | Wt 176.2 lb

## 2024-01-16 DIAGNOSIS — Z23 Encounter for immunization: Secondary | ICD-10-CM | POA: Diagnosis not present

## 2024-01-16 DIAGNOSIS — E559 Vitamin D deficiency, unspecified: Secondary | ICD-10-CM

## 2024-01-16 DIAGNOSIS — G8929 Other chronic pain: Secondary | ICD-10-CM | POA: Diagnosis not present

## 2024-01-16 DIAGNOSIS — Z13220 Encounter for screening for lipoid disorders: Secondary | ICD-10-CM

## 2024-01-16 DIAGNOSIS — E7800 Pure hypercholesterolemia, unspecified: Secondary | ICD-10-CM

## 2024-01-16 DIAGNOSIS — Z1231 Encounter for screening mammogram for malignant neoplasm of breast: Secondary | ICD-10-CM | POA: Diagnosis not present

## 2024-01-16 DIAGNOSIS — Z131 Encounter for screening for diabetes mellitus: Secondary | ICD-10-CM

## 2024-01-16 DIAGNOSIS — M542 Cervicalgia: Secondary | ICD-10-CM | POA: Diagnosis not present

## 2024-01-16 DIAGNOSIS — E2839 Other primary ovarian failure: Secondary | ICD-10-CM

## 2024-01-16 DIAGNOSIS — Z0000 Encounter for general adult medical examination without abnormal findings: Secondary | ICD-10-CM

## 2024-01-16 DIAGNOSIS — M25561 Pain in right knee: Secondary | ICD-10-CM

## 2024-01-16 DIAGNOSIS — E663 Overweight: Secondary | ICD-10-CM

## 2024-01-16 LAB — COMPREHENSIVE METABOLIC PANEL WITH GFR
ALT: 16 U/L (ref 0–35)
AST: 17 U/L (ref 0–37)
Albumin: 4.2 g/dL (ref 3.5–5.2)
Alkaline Phosphatase: 47 U/L (ref 39–117)
BUN: 15 mg/dL (ref 6–23)
CO2: 28 meq/L (ref 19–32)
Calcium: 8.9 mg/dL (ref 8.4–10.5)
Chloride: 103 meq/L (ref 96–112)
Creatinine, Ser: 0.95 mg/dL (ref 0.40–1.20)
GFR: 65.13 mL/min (ref >60.00)
Glucose, Bld: 85 mg/dL (ref 70–99)
Potassium: 4.3 meq/L (ref 3.5–5.1)
Sodium: 141 meq/L (ref 135–145)
Total Bilirubin: 0.5 mg/dL (ref 0.2–1.2)
Total Protein: 6.8 g/dL (ref 6.0–8.3)

## 2024-01-16 LAB — CBC
HCT: 46.3 % — ABNORMAL HIGH (ref 36.0–46.0)
Hemoglobin: 15.4 g/dL — ABNORMAL HIGH (ref 12.0–15.0)
MCHC: 33.3 g/dL (ref 30.0–36.0)
MCV: 92.9 fl (ref 78.0–100.0)
Platelets: 268 K/uL (ref 150.0–400.0)
RBC: 4.99 Mil/uL (ref 3.87–5.11)
RDW: 13.7 % (ref 11.5–15.5)
WBC: 7.6 K/uL (ref 4.0–10.5)

## 2024-01-16 LAB — LIPID PANEL
Cholesterol: 216 mg/dL — ABNORMAL HIGH (ref 0–200)
HDL: 43.4 mg/dL (ref >39.00)
LDL Cholesterol: 139 mg/dL — ABNORMAL HIGH (ref 0–99)
NonHDL: 172.94
Total CHOL/HDL Ratio: 5
Triglycerides: 171 mg/dL — ABNORMAL HIGH (ref 0.0–149.0)
VLDL: 34.2 mg/dL (ref 0.0–40.0)

## 2024-01-16 LAB — TSH: TSH: 1.74 u[IU]/mL (ref 0.35–5.50)

## 2024-01-16 LAB — HEMOGLOBIN A1C: Hgb A1c MFr Bld: 5.9 % (ref 4.6–6.5)

## 2024-01-16 MED ORDER — TRAMADOL HCL 50 MG PO TABS
50.0000 mg | ORAL_TABLET | Freq: Two times a day (BID) | ORAL | 5 refills | Status: AC | PRN
Start: 2024-01-16 — End: ?

## 2024-01-16 NOTE — Patient Instructions (Addendum)

## 2024-01-16 NOTE — Addendum Note (Signed)
 Addended by: GEORGEAN BEEN A on: 01/16/2024 01:29 PM   Modules accepted: Orders

## 2024-01-16 NOTE — Progress Notes (Addendum)
 .    Patient ID: Maeola LITTIE Halsted, female  DOB: 1965-09-29, 58 y.o.   MRN: [id number] Patient Care Team    Relationship Specialty Notifications Start End  Catherine Charlies LABOR, DO PCP - General Family Medicine  07/29/15     Chief Complaint  Patient presents with   Annual Exam    Subjective: KATRENIA ALKINS is a 58 y.o.  Female  present for CPE All past medical history, surgical history, allergies, family history, immunizations, medications and social history were updated in the electronic medical record today. All recent labs, ED visits and hospitalization within the last year were reviewed.  Health maintenance:  Colon cancer screening: Cologuard 06/18/2021.  Repeat in 3 years  Mammogram: completed 09/06/2022.  [facility] [address] Cervical cancer screening: last pap: 09/02/2016 by: GYN> referred to new GYN today Immunizations: tdap-updated today, Influenza (encouraged yearly). Shingrix declined Infectious disease screening: HIV and Hep C completed 08/19/2015 DEXA: routine screen 60-65> ordered Assistive device: none Oxygen [id number] Patient has a Dental home. Hospitalizations/ED visits: Reviewed   Encounter for chronic backpain/right knee pain:Pt presents for follow up on chronic pain of her knees.  She reports use of   tramadol  50 mg once a day every couple days helps with QOL and she is able to remain active. She states sometimes she will need twice a day if she has a flare, which is occurs if she has been on her feet or walking more. She reports tramadol  works well for her. NSAIDS are not helpful. Indication for chronic opioid: Knee pain Medication and dose: tramadol  #60, 50 BID PRN # pills per month: #60 Pain contract signed (Y/N): Yes Date narcotic database last reviewed (include red flags): 01/16/24      01/16/2024    1:07 PM 09/13/2022    1:40 PM  GAD 7 : Generalized Anxiety Score  Nervous, Anxious, on Edge 0 0  Control/stop worrying 0 0  Worry too much - different things 0 0   Trouble relaxing 1 0  Restless 0 0  Easily annoyed or irritable 0 0  Afraid - awful might happen 0 0  Total GAD 7 Score 1 0  Anxiety Difficulty Not difficult at all Not difficult at all     Immunization History  Administered Date(s) Administered   Tdap 02/08/2014, 01/16/2024   Past Medical History:  Diagnosis Date   Acute torticollis 2017   Anxiety    Insomnia    Onychomycosis    Perimenopause    Allergies  Allergen Reactions   Baclofen  Other (See Comments)    loopy    Past Surgical History:  Procedure Laterality Date   CESAREAN SECTION     Family History  Problem Relation Age of Onset   Breast cancer Mother 51   Colon cancer Maternal Grandmother 52   Social History   Social History Narrative   Married, husband Ron. 2 children [place], [place])    AAS degree, Loss analyst    Drinks caffeine, uses herbal remedies.    Wears seatbelt, exercises >3x a week, smoke detector in the home   Firearms  In the home (locked cabinets)   Feels safe in her relationships.     Allergies as of 01/16/2024       Reactions   Baclofen  Other (See Comments)   loopy         Medication List        Accurate as of January 16, 2024  1:32 PM. If you have any questions,  ask your nurse or doctor.          MULTIVITAMIN ADULT PO Take by mouth.   traMADol  50 MG tablet Commonly known as: ULTRAM  Take 1 tablet (50 mg total) by mouth every 12 (twelve) hours as needed for moderate pain (pain score 4-6). What changed: Another medication with the same name was removed. Continue taking this medication, and follow the directions you see here. Changed by: Charlies Bellini        All past medical history, surgical history, allergies, family history, immunizations andmedications were updated in the EMR today and reviewed under the history and medication portions of their EMR.     ROS 14 pt review of systems performed and negative (unless mentioned in an HPI)  Objective: BP 114/78    Pulse 77   Temp 98.2 F (36.8 C)   Ht 5' 5 (1.651 m)   Wt 176 lb 3.4 oz (79.9 kg)   LMP 07/02/2015   SpO2 98%   BMI 29.32 kg/m  Physical Exam Vitals and nursing note reviewed.  Constitutional:      General: She is not in acute distress.    Appearance: Normal appearance. She is not ill-appearing or toxic-appearing.  HENT:     Head: Normocephalic and atraumatic.     Right Ear: Tympanic membrane, ear canal and external ear normal. There is no impacted cerumen.     Left Ear: Tympanic membrane, ear canal and external ear normal. There is no impacted cerumen.     Nose: No congestion or rhinorrhea.     Mouth/Throat:     Mouth: Mucous membranes are moist.     Pharynx: Oropharynx is clear. No oropharyngeal exudate or posterior oropharyngeal erythema.  Eyes:     General: No scleral icterus.       Right eye: No discharge.        Left eye: No discharge.     Extraocular Movements: Extraocular movements intact.     Conjunctiva/sclera: Conjunctivae normal.     Pupils: Pupils are equal, round, and reactive to light.  Cardiovascular:     Rate and Rhythm: Normal rate and regular rhythm.     Pulses: Normal pulses.     Heart sounds: Normal heart sounds. No murmur heard.    No friction rub. No gallop.  Pulmonary:     Effort: Pulmonary effort is normal. No respiratory distress.     Breath sounds: Normal breath sounds. No stridor. No wheezing, rhonchi or rales.  Chest:     Chest wall: No tenderness.  Abdominal:     General: Abdomen is flat. Bowel sounds are normal. There is no distension.     Palpations: Abdomen is soft. There is no mass.     Tenderness: There is no abdominal tenderness. There is no right CVA tenderness, left CVA tenderness, guarding or rebound.     Hernia: No hernia is present.  Musculoskeletal:        General: No swelling, tenderness or deformity. Normal range of motion.     Cervical back: Normal range of motion and neck supple. No rigidity or tenderness.     Right lower  leg: No edema.     Left lower leg: No edema.  Lymphadenopathy:     Cervical: No cervical adenopathy.  Skin:    General: Skin is warm and dry.     Coloration: Skin is not jaundiced or pale.     Findings: No bruising, erythema, lesion or rash.  Neurological:     General:  No focal deficit present.     Mental Status: She is alert and oriented to person, place, and time. Mental status is at baseline.     Cranial Nerves: No cranial nerve deficit.     Sensory: No sensory deficit.     Motor: No weakness.     Coordination: Coordination normal.     Gait: Gait normal.     Deep Tendon Reflexes: Reflexes normal.  Psychiatric:        Mood and Affect: Mood normal.        Behavior: Behavior normal.        Thought Content: Thought content normal.        Judgment: Judgment normal.      No results found.  Assessment/plan: MOLLEY HOUSER is a 58 y.o. female present for CPE Elevated LDL cholesterol level/Overweight (BMI 25.0-29.9) Diet controlled Lipid panel collected today  Chronic pain of right knee Encounter for chronic pain management Cervical spine pain stable Chronic pain.> improved QOL continue tramdol BID PRN   Routine general medical examination at a health care facility Colon cancer screening: Cologuard 06/18/2021.  Repeat in 3 years  Mammogram: completed 09/06/2022.  [facility] [address] Cervical cancer screening: last pap: 09/02/2016 by: GYN> referred to new GYN today Immunizations: tdap-updated today, Influenza (encouraged yearly). Shingrix declined Infectious disease screening: HIV and Hep C completed 08/19/2015 DEXA: routine screen 60-65> ordered Patient was encouraged to exercise greater than 150 minutes a week. Patient was encouraged to choose a diet filled with fresh fruits and vegetables, and lean meats. AVS provided to patient today for education/recommendation on gender specific health and safety maintenance.    Return in about 1 year (around 01/16/2025) for cpe (20  min), Routine chronic condition follow-up.  Orders Placed This Encounter  Procedures   DG Bone Density   MM 3D SCREENING MAMMOGRAM BILATERAL BREAST   Tdap vaccine greater than or equal to 7yo IM   CBC   Comprehensive metabolic panel with GFR   Hemoglobin A1c   Lipid panel   TSH   Meds ordered this encounter  Medications   traMADol  (ULTRAM ) 50 MG tablet    Sig: Take 1 tablet (50 mg total) by mouth every 12 (twelve) hours as needed for moderate pain (pain score 4-6).    Dispense:  60 tablet    Refill:  5   Referral Orders  No referral(s) requested today      Electronically signed by: Charlies Bellini, DO [facility] [place]

## 2024-01-17 ENCOUNTER — Ambulatory Visit: Payer: Self-pay | Admitting: Family Medicine
